# Patient Record
Sex: Female | Born: 1955 | Race: Black or African American | Hispanic: No | Marital: Single | Smoking: Never smoker
Health system: Southern US, Community
[De-identification: ages and names within clinical notes are randomized; demographics above are authoritative.]

## PROBLEM LIST (undated history)

## (undated) DIAGNOSIS — K219 Gastro-esophageal reflux disease without esophagitis: Secondary | ICD-10-CM

## (undated) DIAGNOSIS — I10 Essential (primary) hypertension: Secondary | ICD-10-CM

## (undated) DIAGNOSIS — R928 Other abnormal and inconclusive findings on diagnostic imaging of breast: Secondary | ICD-10-CM

## (undated) HISTORY — DX: Gastro-esophageal reflux disease without esophagitis: K21.9

## (undated) HISTORY — DX: Essential (primary) hypertension: I10

## (undated) HISTORY — DX: Other abnormal and inconclusive findings on diagnostic imaging of breast: R92.8

---

## 2009-02-27 ENCOUNTER — Encounter (INDEPENDENT_AMBULATORY_CARE_PROVIDER_SITE_OTHER): Payer: Self-pay | Admitting: *Deleted

## 2009-02-27 DIAGNOSIS — I10 Essential (primary) hypertension: Secondary | ICD-10-CM

## 2009-02-27 DIAGNOSIS — I152 Hypertension secondary to endocrine disorders: Secondary | ICD-10-CM | POA: Insufficient documentation

## 2009-04-04 ENCOUNTER — Ambulatory Visit: Payer: Self-pay | Admitting: Family Medicine

## 2009-05-13 ENCOUNTER — Encounter: Payer: Self-pay | Admitting: Family Medicine

## 2009-05-13 ENCOUNTER — Ambulatory Visit: Payer: Self-pay | Admitting: Family Medicine

## 2009-05-13 LAB — CONVERTED CEMR LAB
AST: 22 units/L (ref 0–37)
Albumin: 4.3 g/dL (ref 3.5–5.2)
BUN: 13 mg/dL (ref 6–23)
Calcium: 10 mg/dL (ref 8.4–10.5)
Chloride: 103 meq/L (ref 96–112)
Glucose, Bld: 105 mg/dL — ABNORMAL HIGH (ref 70–99)
HDL: 52 mg/dL (ref >39)
Potassium: 3.6 meq/L (ref 3.5–5.3)
Sodium: 141 meq/L (ref 135–145)
Total Protein: 8 g/dL (ref 6.0–8.3)
Triglycerides: 130 mg/dL (ref <150)

## 2009-05-14 ENCOUNTER — Encounter: Payer: Self-pay | Admitting: Family Medicine

## 2009-05-15 ENCOUNTER — Encounter: Payer: Self-pay | Admitting: Family Medicine

## 2009-07-09 ENCOUNTER — Telehealth: Payer: Self-pay | Admitting: Family Medicine

## 2009-07-09 DIAGNOSIS — R911 Solitary pulmonary nodule: Secondary | ICD-10-CM

## 2009-07-10 ENCOUNTER — Encounter: Payer: Self-pay | Admitting: Family Medicine

## 2009-07-11 ENCOUNTER — Encounter: Payer: Self-pay | Admitting: Family Medicine

## 2009-07-11 ENCOUNTER — Ambulatory Visit (HOSPITAL_COMMUNITY): Admission: RE | Admit: 2009-07-11 | Discharge: 2009-07-11 | Payer: Self-pay | Admitting: Ophthalmology

## 2009-07-19 ENCOUNTER — Ambulatory Visit: Payer: Self-pay | Admitting: Family Medicine

## 2009-07-25 ENCOUNTER — Telehealth: Payer: Self-pay | Admitting: Family Medicine

## 2009-07-25 ENCOUNTER — Ambulatory Visit (HOSPITAL_COMMUNITY): Admission: RE | Admit: 2009-07-25 | Discharge: 2009-07-25 | Payer: Self-pay | Admitting: Family Medicine

## 2009-07-25 ENCOUNTER — Encounter: Payer: Self-pay | Admitting: Family Medicine

## 2009-07-30 ENCOUNTER — Telehealth: Payer: Self-pay | Admitting: Family Medicine

## 2009-07-30 ENCOUNTER — Ambulatory Visit (HOSPITAL_COMMUNITY): Admission: RE | Admit: 2009-07-30 | Discharge: 2009-07-30 | Payer: Self-pay | Admitting: Family Medicine

## 2009-07-31 ENCOUNTER — Telehealth: Payer: Self-pay | Admitting: *Deleted

## 2009-07-31 DIAGNOSIS — R1909 Other intra-abdominal and pelvic swelling, mass and lump: Secondary | ICD-10-CM

## 2009-08-01 ENCOUNTER — Ambulatory Visit (HOSPITAL_COMMUNITY): Admission: RE | Admit: 2009-08-01 | Discharge: 2009-08-01 | Payer: Self-pay | Admitting: Family Medicine

## 2009-08-02 ENCOUNTER — Ambulatory Visit (HOSPITAL_COMMUNITY): Admission: RE | Admit: 2009-08-02 | Discharge: 2009-08-02 | Payer: Self-pay | Admitting: Family Medicine

## 2009-08-05 ENCOUNTER — Telehealth: Payer: Self-pay | Admitting: Family Medicine

## 2009-08-05 ENCOUNTER — Encounter: Payer: Self-pay | Admitting: Family Medicine

## 2009-08-29 ENCOUNTER — Ambulatory Visit: Payer: Self-pay | Admitting: Family Medicine

## 2009-08-29 ENCOUNTER — Encounter: Payer: Self-pay | Admitting: Family Medicine

## 2009-08-30 LAB — CONVERTED CEMR LAB: CA 125: 5.7 units/mL (ref 0.0–30.2)

## 2009-09-21 HISTORY — PX: CATARACT EXTRACTION: SUR2

## 2009-10-24 ENCOUNTER — Ambulatory Visit: Payer: Self-pay | Admitting: Obstetrics & Gynecology

## 2009-10-24 ENCOUNTER — Ambulatory Visit: Payer: Self-pay | Admitting: Family Medicine

## 2009-10-24 DIAGNOSIS — R05 Cough: Secondary | ICD-10-CM

## 2009-10-24 DIAGNOSIS — M25519 Pain in unspecified shoulder: Secondary | ICD-10-CM | POA: Insufficient documentation

## 2009-10-29 ENCOUNTER — Telehealth: Payer: Self-pay | Admitting: *Deleted

## 2009-10-29 ENCOUNTER — Ambulatory Visit (HOSPITAL_COMMUNITY): Admission: RE | Admit: 2009-10-29 | Discharge: 2009-10-29 | Payer: Self-pay | Admitting: Family Medicine

## 2009-11-05 ENCOUNTER — Telehealth: Payer: Self-pay | Admitting: Family Medicine

## 2009-11-07 ENCOUNTER — Telehealth (INDEPENDENT_AMBULATORY_CARE_PROVIDER_SITE_OTHER): Payer: Self-pay | Admitting: *Deleted

## 2009-11-15 ENCOUNTER — Ambulatory Visit: Payer: Self-pay | Admitting: Obstetrics & Gynecology

## 2010-01-06 ENCOUNTER — Ambulatory Visit (HOSPITAL_COMMUNITY): Admission: RE | Admit: 2010-01-06 | Discharge: 2010-01-06 | Payer: Self-pay | Admitting: Family Medicine

## 2010-01-07 ENCOUNTER — Telehealth: Payer: Self-pay | Admitting: Family Medicine

## 2010-01-07 ENCOUNTER — Encounter: Payer: Self-pay | Admitting: Family Medicine

## 2010-02-11 ENCOUNTER — Ambulatory Visit: Payer: Self-pay | Admitting: Family Medicine

## 2010-02-21 ENCOUNTER — Ambulatory Visit: Payer: Self-pay | Admitting: Family Medicine

## 2010-02-21 DIAGNOSIS — M25569 Pain in unspecified knee: Secondary | ICD-10-CM | POA: Insufficient documentation

## 2010-07-11 ENCOUNTER — Ambulatory Visit: Payer: Self-pay | Admitting: Family Medicine

## 2010-08-13 ENCOUNTER — Ambulatory Visit: Payer: Self-pay | Admitting: Family Medicine

## 2010-08-26 ENCOUNTER — Ambulatory Visit (HOSPITAL_COMMUNITY)
Admission: RE | Admit: 2010-08-26 | Discharge: 2010-08-26 | Payer: Self-pay | Source: Home / Self Care | Admitting: Family Medicine

## 2010-09-10 ENCOUNTER — Ambulatory Visit (HOSPITAL_COMMUNITY)
Admission: RE | Admit: 2010-09-10 | Discharge: 2010-09-10 | Payer: Self-pay | Source: Home / Self Care | Attending: Family Medicine | Admitting: Family Medicine

## 2010-09-24 ENCOUNTER — Encounter
Admission: RE | Admit: 2010-09-24 | Discharge: 2010-09-24 | Payer: Self-pay | Source: Home / Self Care | Attending: Family Medicine | Admitting: Family Medicine

## 2010-10-12 ENCOUNTER — Encounter: Payer: Self-pay | Admitting: Family Medicine

## 2010-10-21 NOTE — Assessment & Plan Note (Signed)
Summary: RESCH'D FROM 5/24 FOR F/U [facility]   Vital Signs:  Patient profile:   55 year old female Height:      59.5 inches Weight:      142 pounds BMI:     28.30 BSA:     1.61 Temp:     97.6 degrees F Pulse rate:   75 / minute BP sitting:   125 / 86  Vitals Entered By: Jone Baseman CMA (February 21, 2010 8:51 AM) CC: 3 month check-up Is Patient Diabetic? No Pain Assessment Patient in pain? no        Primary Care Provider:  Romero Belling MD  CC:  3 month check-up.  History of Present Illness: 55 yo female presenting for  KNEE PAIN, RIGHT (ICD-719.46) Intermittent 5/10 pain on the superior lateral aspect of the patella approximately every 2-3 days for years.  Course is stable, not getting more severe or more frequent.  Feels worse on treadmill.  Occasionally has weakness associated with it, though has never locked up or given out.  Denies history of trauma to the knee.  Does not use any medicines or other treatments for this.  COUGH (ICD-786.2) Chronic productive cough associated with sneezing, since approximately February.  Moderate relief from Mucinex.  Patient with known history of tuberculosis and LEFT pulmonary nodule.  No dysnpea or fevers.  Also denies night sweats, hemoptysis, weight loss, wheeze, rhinorrhea, nasal congestion, otalgia, sore throat.  HYPERTENSION (ICD-401.9) Taking HCTZ as prescribed without side effects.  Denies chest pain, dyspnea, PND, LE edema.  Some question of intermittent orthopnea, though not clear on this.  We also addressed:  ADNEXAL MASS, LEFT (ICD-789.39) Advised need to have repeat U/S in February 2012.  Daughter and patient verbalize understanding.  PULMONARY NODULE (ICD-518.89) Advised need to have repeat CT in October 2011.  Daughter and patient verbalize understanding.  ** Daughter present for translation.  Habits & Providers  Alcohol-Tobacco-Diet     Tobacco Status: never  Current Medications (verified): 1)  Hydrochlorothiazide  25 Mg  Tabs (Hydrochlorothiazide) .Marland Kitchen.. 1 Tab By Mouth Daily For High Blood Pressure  Allergies (verified): No Known Drug Allergies  Past History:  Past Surgical History: Cataract surgery 2010--much improved vision.  Physical Exam  Additional Exam:  VITALS:  Reviewed, normal GEN: Alert & oriented, no acute distress, pleasant, cooperative NECK: Midline trachea, no masses/thyromegaly, no cervical lymphadenopathy CARDIO: Regular rate and rhythm, no murmurs/rubs/gallops, 2+ bilateral radial pulses RESP: Clear to auscultation, normal work of breathing, no retractions/accessory muscle use EXT: Nontender, no edema MSK:  RIGHT KNEE - 5/5 strength and FROM flexion/extension, nontender, no swelling, mobile patella, no joint line tenderness.  Negative McMurray and anterior drawer.   Impression & Recommendations:  Problem # 1:  KNEE PAIN, RIGHT (ICD-719.46) Assessment New  Likely osteoarthritis.  See patient instructions.  Orders: [facility]- Est  Level 4 ([address])  Problem # 2:  COUGH (ICD-786.2) Assessment: Unchanged Likely allergic rhinitis.  Will treat presumptively with Flonase and Cetirizine.  Given h/o treated TB, will check CXR despite absence of worrisome symptoms. Orders: CXR- 2view (CXR) [facility]- Est  Level 4 ([address])  Problem # 3:  HYPERTENSION (ICD-401.9) Assessment: Improved  Check yearly BMet in August.  No changes today. Her updated medication list for this problem includes:    Hydrochlorothiazide 25 Mg Tabs (Hydrochlorothiazide) .Marland Kitchen... 1 tab by mouth daily for high blood pressure  BP today: 125/86 Prior BP: 122/82 (02/11/2010)  Labs Reviewed: K+: 3.6 (05/13/2009) Creat: : 0.76 (05/13/2009)   Chol:  184 (05/13/2009)   HDL: 52 (05/13/2009)   LDL: 106 (05/13/2009)   TG: 130 (05/13/2009)  Orders: [facility]- Est  Level 4 ([address])  Problem # 4:  ADNEXAL MASS, LEFT (ICD-789.39) Assessment: Comment Only Advised need to have repeat U/S in February 2012.  Daughter and patient verbalize  understanding.  Problem # 5:  PULMONARY NODULE (ICD-518.89) Assessment: Comment Only Advised need to have repeat CT in October 2011.  Daughter and patient verbalize understanding.  Problem # 6:  Preventive Health Care (ICD-V70.0) Assessment: Comment Only  At end of visit, daughter requests referral to dentist for preventive care.  Dental complaint not addressed.  Referral made.  Orders: Dental Referral (Dentist)  Complete Medication List: 1)  Hydrochlorothiazide 25 Mg Tabs (Hydrochlorothiazide) .Marland Kitchen.. 1 tab by mouth daily for high blood pressure 2)  Cetirizine Hcl 10 Mg Tabs (Cetirizine hcl) .Marland Kitchen.. 1 tab by mouth daily for allergies 3)  Fluticasone Propionate 50 Mcg/act Susp (Fluticasone propionate) .Marland Kitchen.. 1 spray each nostril daily for allergies  Patient Instructions: 1)  ** Please schedule an appointment in July or August to meet your new doctor. 2)  For you knee pain, please use ice 20 minutes every 2 hours (as much as possible) and Tylenol 650 mg by mouth three times a day.  Schedule a follow up appointment if not improving with these measures. 3)  For your cough, I have ordered a chest Xray.  I will call and discuss any abnormal results with you.  I think this is likely allergies, so start Fluticasone and Cetirizine as prescribed below. 4)  Please schedule an appointment in October to have your repeat lung CT ordered. 5)  Please schedule an appointment in February to have your repeat pelvic ultrasound ordered. Prescriptions: FLUTICASONE PROPIONATE 50 MCG/ACT SUSP (FLUTICASONE PROPIONATE) 1 spray each nostril daily for allergies  #1 x 1   Entered and Authorized by:   Romero Belling MD   Signed by:   Romero Belling MD on 02/21/2010   Method used:   Electronically to        [facility] [phone]* (retail)       [address]       [place], [place]  [address]       Ph: [phone]       Fax: [phone]   RxID:   [id number] CETIRIZINE HCL 10 MG TABS  (CETIRIZINE HCL) 1 tab by mouth daily for allergies  #30 x 1   Entered and Authorized by:   Romero Belling MD   Signed by:   Romero Belling MD on 02/21/2010   Method used:   Electronically to        [facility] [phone]* (retail)       [address]       [place], [place]  [address]       Ph: [phone]       Fax: [phone]   RxID:   [id number]

## 2010-10-21 NOTE — Assessment & Plan Note (Signed)
Summary: flu shot/bmc  Nurse Visit   Vital Signs:  Patient profile:   55 year old female Temp:     98.3 degrees F  Vitals Entered By: Theresia Lo RN (July 11, 2010 10:17 AM)  Allergies: No Known Drug Allergies  Immunizations Administered:  Influenza Vaccine # 1:    Vaccine Type: Fluvax 3+    Site: left deltoid    Mfr: GlaxoSmithKline    Dose: 0.5 ml    Given by: Theresia Lo RN    Exp. Date: 03/18/2011    Lot #: [id number]    VIS given: 04/15/10 version given July 11, 2010.  Flu Vaccine Consent Questions:    Do you have a history of severe allergic reactions to this vaccine? no    Any prior history of allergic reactions to egg and/or gelatin? no    Do you have a sensitivity to the preservative Thimersol? no    Do you have a past history of Guillan-Barre Syndrome? no    Do you currently have an acute febrile illness? no    Have you ever had a severe reaction to latex? no    Vaccine information given and explained to patient? yes    Are you currently pregnant? no  Orders Added: 1)  Flu Vaccine 65yrs + [90658] 2)  Admin 1st Vaccine [09811]

## 2010-10-21 NOTE — Progress Notes (Signed)
Summary: phn msg  Phone Note Call from Patient Call back at Home Phone [phone]   Caller: Daughter-Hana Summary of Call: returning call concerning rx.   Initial call taken by: Clydell Hakim,  November 07, 2009 9:20 AM  Follow-up for Phone Call        spoke with daughter see previous message. Follow-up by: Theresia Lo RN,  November 07, 2009 11:16 AM

## 2010-10-21 NOTE — Progress Notes (Signed)
Summary: Rx Prob  Phone Note Call from Patient Call back at Home Phone [phone]   Caller: Daughter-Hannah Summary of Call: Says that the ear drops called in were to expensive.  The pharmacy gave her a name of something else and to see if they would work in its place.  Cortistorin is much cheaper, but not sure if this one is suitiable.  The pharmacy is [facility] on [address]. Initial call taken by: Clydell Hakim,  November 05, 2009 12:02 PM  Follow-up for Phone Call        there was a message from 10/29/2009 and MD sent in another ear drop at that time. message left on voicemail to call back . if she is not aware that this Rx has been sent in, will need to pick up, if this med is too expensive also will send message back to MD. Follow-up by: Theresia Lo RN,  November 05, 2009 1:58 PM  Additional Follow-up for Phone Call Additional follow up Details #1::        spoke with daughter and she states the second eardrops sent  in were as expensive as the first. will forward to MD. Additional Follow-up by: Theresia Lo RN,  November 05, 2009 4:07 PM    Additional Follow-up for Phone Call Additional follow up Details #2::    Okay to try using half strength vinegar in ears two times a day. Follow-up by: Romero Belling MD,  November 05, 2009 4:13 PM  Additional Follow-up for Phone Call Additional follow up Details #3:: Details for Additional Follow-up Action Taken: message left on voicemail to return call. Additional Follow-up by: Theresia Lo RN,  November 06, 2009 4:41 PM   Dr. Constance Goltz gave verbal order to use the half strength vinegar 2-3 drops two times daily. daughter notified. Theresia Lo RN  November 07, 2009 11:17 AM

## 2010-10-21 NOTE — Assessment & Plan Note (Signed)
Summary: f/up,tcb   Vital Signs:  Patient profile:   55 year old female Height:      59.5 inches Weight:      140 pounds BMI:     27.90 BSA:     1.60 Temp:     98.4 degrees F Pulse rate:   82 / minute BP sitting:   137 / 82  Vitals Entered By: Jone Baseman CMA (October 24, 2009 9:56 AM) CC: f/u Is Patient Diabetic? No Pain Assessment Patient in pain? yes     Location: left shoulder Intensity: 4   CC:  f/u.  History of Present Illness: 55 yo female here for:  HTN.  No chest pain or dyspnia.  Taking medicines as prescribed.  LEFT OTALGIA.  Duration weeks.  No hearing loss.  LEFT SHOULDER PAIN.  Trapezius soreness without weakness x1 week.  Using heat packs with minimal relief.  No medicines.  COUGH.  Duration 2-3 months.  Improved with mucinex but now returned.  No fever, dyspnea, dyspepsia, chest pain.  Had rhinorrhea last week.  Notably has lung mass of unknown significance found on CXR which is to be followed up in a few months.  LEFT ADNEXAL MASS.  Found incidentally.  Has f/u appt with [facility] clinic today at 1 pm.  PREVENTION.  Due for mammo.  Pap up to date.  Requests 3rd tetanus vaccination today.  Flu up to date.  Habits & Providers  Alcohol-Tobacco-Diet     Tobacco Status: never  Current Problems (verified): 1)  Hypertension  (ICD-401.9) 2)  Cerumen Impaction, Left  (ICD-380.4) 3)  Shoulder Pain, Left  (ICD-719.41) 4)  Cough  (ICD-786.2) 5)  Adnexal Mass, Left  (ICD-789.39) 6)  Pulmonary Nodule  (ICD-518.89) 7)  Cataracts, Bilateral  (ICD-366.9)  Current Medications (verified): 1)  Hydrochlorothiazide 25 Mg  Tabs (Hydrochlorothiazide) .Marland Kitchen.. 1 Tab By Mouth Daily For High Blood Pressure  Allergies (verified): No Known Drug Allergies  Past History:  Past Surgical History: Cataract surgery 2010.  Physical Exam  General:  Well-developed,well-nourished,in no acute distress; alert,appropriate and cooperative throughout examination Ears:  Cerumen  left ear.  Red inflamed ear canal after irrigation. Lungs:  Normal respiratory effort, chest expands symmetrically. Lungs are clear to auscultation, no crackles or wheezes. Heart:  Normal rate and regular rhythm. S1 and S2 normal without gallop, murmur, click, rub or other extra sounds. Msk:  5/5 strength and FROM bilateral UE.  No focal tenderness or swelling.   Impression & Recommendations:  Problem # 1:  HYPERTENSION (ICD-401.9) Assessment Improved  Well controlled. Her updated medication list for this problem includes:    Hydrochlorothiazide 25 Mg Tabs (Hydrochlorothiazide) .Marland Kitchen... 1 tab by mouth daily for high blood pressure  BP today: 137/82 Prior BP: 145/92 (07/19/2009)  Labs Reviewed: K+: 3.6 (05/13/2009) Creat: : 0.76 (05/13/2009)   Chol: 184 (05/13/2009)   HDL: 52 (05/13/2009)   LDL: 106 (05/13/2009)   TG: 130 (05/13/2009)  Orders: [facility]- Est  Level 4 ([address])  Problem # 2:  CERUMEN IMPACTION, LEFT (ICD-380.4) Assessment: New  Irrigation in clinic today.  Likely otitis externa component--ciprodex x7 days.  Orders: [facility]- Est  Level 4 ([address])  Problem # 3:  SHOULDER PAIN, LEFT (ICD-719.41) Assessment: New  Likely muscle strain.  Advised heat, APAP, given Rx for Flexeril though they will try other measures first.  Her updated medication list for this problem includes:    Cyclobenzaprine Hcl 10 Mg Tabs (Cyclobenzaprine hcl) .Marland Kitchen... 1/2 - 1 tab by mouth three times  a day as needed for shoulder pain  Orders: [facility]- Est  Level 4 ([address])  Problem # 4:  COUGH (ICD-786.2) Assessment: New  Postnasal drip secondary to allergies vs asthma vs GERD.  Favor allergies at this point.  Will treat with Cetirizine.  Patient has pulmonary nodule found on CXR which will require f/u so asked to RTC in 1 week if not better.  Orders: [facility]- Est  Level 4 ([address])  Problem # 5:  ADNEXAL MASS, LEFT (ICD-789.39) Assessment: Comment Only [facility] g[facility] clinic [facility] at [facility].  Problem # 6:  PULMONARY  NODULE (ICD-518.89) LUL nodule found on routine CXR for pre-op on 10/18.  Negative PET scan--recommend 6 month follow-up.  Will schedule CT scan of chest for mid-April today.  Future order for Bmet entered so Cr can be checked 1 week prior to CT.  Orders: CT with Contrast (CT w/ contrast)Future Orders: Basic Met-[facility] ([address]-28413) ... 10/24/2010  Problem # 7:  Preventive Health Care (ICD-V70.0) Assessment: Comment Only Td vaccination today.  She has already received Td x1 and Tdap x1, so this will complete her initial series.  Due for booster in 10 years. Given mammo info today. Will discuss colon screening at next visit. Flu and Pap up to date.  Complete Medication List: 1)  Hydrochlorothiazide 25 Mg Tabs (Hydrochlorothiazide) .Marland Kitchen.. 1 tab by mouth daily for high blood pressure 2)  Cyclobenzaprine Hcl 10 Mg Tabs (Cyclobenzaprine hcl) .... 1/2 - 1 tab by mouth three times a day as needed for shoulder pain 3)  Ciprodex 0.3-0.1 % Susp (Ciprofloxacin-dexamethasone) .... 4 gtt left ear two times a day x7 days  Other Orders: TD Toxoids IM 7 YR + ([address]) Admin 1st Vaccine (02725) Admin 1st Vaccine [facility]) [phone])  Patient Instructions: 1)  We are scheduling a CT of the chest to follow up the nodule that was found during your chest Xray.  This will be in April--we will call with appointment. 2)  Please try Cetirizine 10 mg (generic, over the counter) for the cough.  If not better in 1 week, please come back. 3)  For shoulder pain, use Acetaminophen 1000 mg by mouth three times a day.  Continue to use heat backs.  I have also given you a prescription for Cyclobenzaprine to try as well in case Acetaminophen and heat don't help. 4)  Please schedule an appointment to have your mammogram as soon as possible.  You can do this by calling either [facility] [phone]) or [facility] [place] [phone]). 5)  Please schedule a follow-up appointment in 3 months .    Prescriptions: CIPRODEX 0.3-0.1 % SUSP (CIPROFLOXACIN-DEXAMETHASONE) 4 gtt left ear two times a day x7 days  #1 x 0   Entered and Authorized by:   Romero Belling MD   Signed by:   Romero Belling MD on 10/24/2009   Method used:   Electronically to        [facility] [phone]* (retail)       [address]       [place], [place]  [address]       Ph: [phone]       Fax: [phone]   RxID:   [id number] CYCLOBENZAPRINE HCL 10 MG TABS (CYCLOBENZAPRINE HCL) 1/2 - 1 tab by mouth three times a day as needed for shoulder pain  #60 x 0   Entered and Authorized by:   Romero Belling MD   Signed  by:   Romero Belling MD on 10/24/2009   Method used:   Electronically to        [facility] [phone]* (retail)       [address]       [place], [place]  [address]       Ph: [phone]       Fax: [phone]   RxID:   [id number]    Tetanus/Td Vaccine    Vaccine Type: Td    Site: left deltoid    Mfr: GlaxoSmithKline    Dose: 0.5 ml    Route: IM    Given by: Garen Grams LPN    Exp. Date: 07/16/2011    Lot #: [id number]    VIS given: 08/09/07 version given October 24, 2009.

## 2010-10-21 NOTE — Progress Notes (Signed)
Summary: meds prob  Phone Note Call from Patient Call back at Home Phone [phone]   Caller: Daughter-Hana Summary of Call: ear drops are too expensive and would like something a little less expensive [facility] Initial call taken by: De Nurse,  October 29, 2009 9:56 AM  Follow-up for Phone Call        to pcp to change Follow-up by: Golden Circle RN,  October 29, 2009 10:01 AM  Additional Follow-up for Phone Call Additional follow up Details #1::        Have sent Rx for [facility], which should be cheaper.  [facility], please call patient to inform. Additional Follow-up by: Romero Belling MD,  October 29, 2009 10:13 AM    Additional Follow-up for Phone Call Additional follow up Details #2::    Pt dgt informed Follow-up by: Jone Baseman CMA,  October 29, 2009 12:04 PM  New/Updated Medications: VOSOL HC 2-1 % SOLN (HYDROCORTISONE-ACETIC ACID) Apply 5 gtt to affected ear three times a day Prescriptions: VOSOL HC 2-1 % SOLN (HYDROCORTISONE-ACETIC ACID) Apply 5 gtt to affected ear three times a day  #1 x 0   Entered and Authorized by:   Romero Belling MD   Signed by:   Romero Belling MD on 10/29/2009   Method used:   Electronically to        [facility] [phone]* (retail)       [address]       [place], [place]  [address]       Ph: [phone]       Fax: [phone]   RxID:   [id number]

## 2010-10-21 NOTE — Progress Notes (Signed)
Summary: CT Results  Phone Note Outgoing Call Call back at [facility] Phone [phone]   Call placed by: Romero Belling MD,  January 07, 2010 2:39 PM Call placed to: Patient Summary of Call: Called to discuss CT results.  No answer.  Will send letter.

## 2010-10-21 NOTE — Letter (Signed)
Summary: CT Results  [facility]  [address]   [place], [place] [address]   Phone: [phone]  Fax: [phone]    01/07/2010  SHUNTELL FOODY [address] [place], [place]  [address]  Dear Ms. Army Melia,  I have reviewed the results of your CT scan.  The results are as follows:  "No significant change in size of left lower lobe pulmonary nodule of concern.  This nodule was not hypermetabolic on comparison FDG PET CT and therefore is likely benign.  This may relate to patient's report history of prior tuberculosis.  Due to its large size, recommend follow-up non contrast CT in 6 to 12 months (depending on risks factors for carcinoma) to insure stability and excluded a non hypermetabolic neoplasm."  While it looks like the nodule is not likely to be cancer, we will need to repeat the CT scan in 6-12 months.  Please schedule a visit with my clinic in 6 months to set up the next CT scan.  Sincerely, Romero Belling MD  Appended Document: CT Results mailed.

## 2010-10-21 NOTE — Assessment & Plan Note (Signed)
Summary: meet new doc/bp [facility]   Vital Signs:  Patient profile:   55 year old female Height:      59.5 inches Weight:      144.6 pounds BMI:     28.82 Temp:     98.0 degrees F oral Pulse rate:   82 / minute BP sitting:   131 / 79  (left arm) Cuff size:   regular  Vitals Entered By: Garen Grams LPN (August 13, 2010 8:52 AM) CC: f/u, Meet New MD Is Patient Diabetic? No Pain Assessment Patient in pain? yes     Location: rt knee   Primary Care Provider:  Romero Belling MD  CC:  f/u and Meet New MD.  History of Present Illness: Issues include:  1. Right knee pain:  This is a chronic issue and has been bothering her for years.  She takes Tylenol as needed for the pain which does help.  She more recently has felt like her knee is weak.  Pain is manageable.  Rated a 5/10.  Pain is located at the medial part of the right knee.  ROS: denies catching, locking, or giving out  2. HTN:  She is taking the HCTZ as prescribed.  She doesn't check her blood pressure at home regularly.  ROS: denies chest pain, shortness of breath  3. Persistent cough:  She has had a persistent cough for the past 6 months.  She describes the episodes of pain in her middle chest, followed by some wheezing and coughing.  The episodes last for a couple of minutes and then get better.  Unsure if there is an association with certain foods.  ROS: denies fevers, chills, weight loss, lumps, or bumps  4. Pulmonary nodule:  Had a CT scan about 7 months ago and was supposed to have a follow up one in 6-12 months.  Habits & Providers  Alcohol-Tobacco-Diet     Tobacco Status: never     Tobacco Counseling: not indicated; no tobacco use  Current Medications (verified): 1)  Hydrochlorothiazide 25 Mg  Tabs (Hydrochlorothiazide) .Marland Kitchen.. 1 Tab By Mouth Daily For High Blood Pressure 2)  Omeprazole 10 Mg Cpdr (Omeprazole) .Marland Kitchen.. 1 Tab By Mouth Daily For Acid Reflux  Allergies: No Known Drug Allergies  Past History:  Past  Medical History: Reviewed history from 07/19/2009 and no changes required. Pulmonary Tuberculosis, 1998, treated for 8 months.  Social History: Reviewed history from 07/19/2009 and no changes required. Pt lives with her daughter and son-in-law.  She is currently unemployeed.  Uses no tobacco, alcohol, or illicit drugs.  Emigrated from [place] 2010.  No regular healthcare prior to 03/2009.  Physical Exam  General:  Vitals reviewed.  Well appearing.  Overweight.  No acute distress Head:  normocephalic and atraumatic.   Eyes:  vision grossly intact.  normal conjunctiva Ears:  R ear normal and L ear normal.   Nose:  no nasal discharge.   Mouth:  pharynx pink and moist.   Neck:  supple, full ROM, and no masses.   Lungs:  normal respiratory effort, normal breath sounds, no dullness, no crackles, and no wheezes.   Some transmitted upper respiratory sounds. Heart:  Normal rate and regular rhythm. S1 and S2 normal without gallop, murmur, click, rub or other extra sounds. Abdomen:  soft, non-tender, normal bowel sounds, and no distention.   Msk:  Right knee:  No swelling, redness, or warmth.  No gross deformity.  TTP along medial joint line.  Full ROM.  Good joint  stability.  Negative McMurrays.  Left knee: normal Extremities:  no LE edema Skin:  turgor normal and color normal.   Psych:  not anxious appearing and not depressed appearing.     Impression & Recommendations:  Problem # 1:  KNEE PAIN, RIGHT (ICD-719.46) Assessment Deteriorated Likely from arthritis.  Exam not concerning for meniscus or ligament damage.  Will send for x-ray to confirm.  Treat with daily Tylenol. Orders: Diagnostic X-Ray/Fluoroscopy (Diagnostic X-Ray/Flu) [facility]- Est  Level 4 (16109)  Problem # 2:  COUGH (ICD-786.2) Assessment: Unchanged  Persists.  No medications that could be related.  Pt with a hx of a pulmonary nodule.  This could be related to acid reflux given the substernal discomfort and wheezing and  coughing.  Will try Omeprazole.  Orders: [facility]- Est  Level 4 (60454)  Problem # 3:  HYPERTENSION (ICD-401.9) Assessment: Unchanged  At goal.  Continue current medications. Her updated medication list for this problem includes:    Hydrochlorothiazide 25 Mg Tabs (Hydrochlorothiazide) .Marland Kitchen... 1 tab by mouth daily for high blood pressure  Orders: [facility]- Est  Level 4 (09811)  Problem # 4:  PULMONARY NODULE (ICD-518.89) Assessment: Unchanged Repeat CT scan. Orders: CT without Contrast (CT w/o contrast) [facility]- Est  Level 4 (91478)  Complete Medication List: 1)  Hydrochlorothiazide 25 Mg Tabs (Hydrochlorothiazide) .Marland Kitchen.. 1 tab by mouth daily for high blood pressure 2)  Omeprazole 10 Mg Cpdr (Omeprazole) .Marland Kitchen.. 1 tab by mouth daily for acid reflux  Patient Instructions: 1)  We will get a CT scan of your lungs to make sure the nodule is the same size and to make sure nothing else is going on in your lungs to cause you to cough 2)  We will also get x-rays of your right knee to see if it is arthritis 3)  I would like for you to start taking a medicine for acid reflux to see if that helps with the cough 4)  You can take Tylenol 650mg  every day for the knee pain 5)  Please schedule a follow up appointment in 6-8 weeks to check on your cough Prescriptions: OMEPRAZOLE 10 MG CPDR (OMEPRAZOLE) 1 tab by mouth daily for acid reflux  #30 x 6   Entered and Authorized by:   Angelena Sole MD   Signed by:   Angelena Sole MD on 08/13/2010   Method used:   Electronically to        [facility] [phone]* (retail)       [address]       [place], [place]  [address]       Ph: [phone]       Fax: [phone]   RxID:   [id number]    Orders Added: 1)  CT without Contrast [CT w/o contrast] 2)  Diagnostic X-Ray/Fluoroscopy [Diagnostic X-Ray/Flu] 3)  [facility]- Est  Level 4 [25956]

## 2010-10-21 NOTE — Assessment & Plan Note (Signed)
Summary: f/up,tcb   Vital Signs:  Patient profile:   55 year old female Height:      59.5 inches Weight:      142.1 pounds BMI:     28.32 Temp:     99.0 degrees F oral Pulse rate:   88 / minute BP sitting:   122 / 82  (left arm) Cuff size:   regular  Vitals Entered By: Garen Grams LPN (Feb 11, 2010 10:41 AM) CC: f/u Is Patient Diabetic? No Pain Assessment Patient in pain? yes     Location: headache   Primary Care Provider:  Romero Belling MD  CC:  f/u.  History of Present Illness: Patient had to leave before I could see her.  Not evaluted, will reschedule.  Habits & Providers  Alcohol-Tobacco-Diet     Tobacco Status: never  Allergies: No Known Drug Allergies   Complete Medication List: 1)  Hydrochlorothiazide 25 Mg Tabs (Hydrochlorothiazide) .Marland Kitchen.. 1 tab by mouth daily for high blood pressure 2)  Cyclobenzaprine Hcl 10 Mg Tabs (Cyclobenzaprine hcl) .... 1/2 - 1 tab by mouth three times a day as needed for shoulder pain  Other Orders: Provider Misc Charge- [facility] (Misc)

## 2010-11-14 ENCOUNTER — Ambulatory Visit (INDEPENDENT_AMBULATORY_CARE_PROVIDER_SITE_OTHER): Payer: Self-pay | Admitting: Family Medicine

## 2010-11-14 ENCOUNTER — Encounter: Payer: Self-pay | Admitting: Family Medicine

## 2010-11-14 VITALS — BP 160/99 | HR 98 | Temp 98.4°F | Wt 142.2 lb

## 2010-11-14 DIAGNOSIS — R519 Headache, unspecified: Secondary | ICD-10-CM | POA: Insufficient documentation

## 2010-11-14 DIAGNOSIS — G4700 Insomnia, unspecified: Secondary | ICD-10-CM

## 2010-11-14 DIAGNOSIS — R062 Wheezing: Secondary | ICD-10-CM

## 2010-11-14 DIAGNOSIS — R51 Headache: Secondary | ICD-10-CM

## 2010-11-14 MED ORDER — ALBUTEROL 90 MCG/ACT IN AERS: 2.0000 | INHALATION_SPRAY | Freq: Four times a day (QID) | RESPIRATORY_TRACT | Status: AC | PRN

## 2010-11-14 MED ORDER — CETIRIZINE HCL 10 MG PO CHEW: 10.0000 mg | CHEWABLE_TABLET | Freq: Every day | ORAL | Status: DC

## 2010-11-14 NOTE — Assessment & Plan Note (Signed)
Pt is not sleeping well at night.  Advised proper sleep hygiene.  If not better in a couple of weeks would consider sleep aid.

## 2010-11-14 NOTE — Assessment & Plan Note (Signed)
New wheezing.  Has been spending some more time in chlorinated pool.  Will treat with Albuterol / Zyrtec.  This could also be GERD.  Would consider adding PPI if no relief.

## 2010-11-14 NOTE — Assessment & Plan Note (Signed)
Pt with a hx of headaches.  The most recent one has been lasting longer than usual.  Not typical migraine features.  No neurological deficits.  ? Relation to increased chlorine exposure.  She also is having problems with sleep.  Advised supportive care for now.  Tylenol / Ibuprofen as needed.  RTC in 2 weeks if not better

## 2010-11-14 NOTE — Patient Instructions (Signed)
We will try an Albuterol inhaler for your wheezing I would limit the amount of time that is spent in the pool The headache doesn't seem like anything serious.  Take Tylenol / Ibuprofen as needed Please schedule a follow up appointment in 2 weeks.   If your sleep isn't improved by then we would consider a sleep aid.

## 2010-11-14 NOTE — Progress Notes (Signed)
  Subjective:    Patient ID: Stacy Mccormick, female    DOB: 11-21-1955, 55 y.o.   MRN: [id number]  Headache  This is a new problem. The current episode started in the past 7 days. The problem occurs intermittently. The problem has been unchanged. The pain is located in the occipital region. The pain does not radiate. The pain quality is similar to prior headaches. The quality of the pain is described as aching, band-like and squeezing. The pain is at a severity of 3/10. The pain is mild. Associated symptoms include back pain and nausea. Pertinent negatives include no anorexia, blurred vision, coughing, dizziness, eye pain, fever, hearing loss, loss of balance, numbness, phonophobia, photophobia or weakness. The symptoms are aggravated by nothing (chlorine?). She has tried NSAIDs for the symptoms. The treatment provided significant relief.  Back Pain This is a new problem. The current episode started in the past 7 days. The problem occurs intermittently. The problem has been gradually improving since onset. The pain is present in the thoracic spine. The quality of the pain is described as stabbing. The pain does not radiate. The pain is at a severity of 0/10. The patient is experiencing no pain. The symptoms are aggravated by coughing. Associated symptoms include headaches. Pertinent negatives include no fever, numbness or weakness. (Wheezing) She has tried nothing for the symptoms.      Review of Systems  Constitutional: Negative for fever.  HENT: Negative for hearing loss.   Eyes: Negative for blurred vision, photophobia and pain.  Respiratory: Negative for cough.   Gastrointestinal: Positive for nausea. Negative for anorexia.  Musculoskeletal: Positive for back pain.  Neurological: Positive for headaches. Negative for dizziness, weakness, numbness and loss of balance.       Objective:   Physical Exam  Constitutional: She is oriented to person, place, and time. She appears well-developed  and well-nourished.  HENT:  Head: Normocephalic and atraumatic.  Eyes: Conjunctivae and EOM are normal. Pupils are equal, round, and reactive to light.  Neck: Normal range of motion. Neck supple. No thyromegaly present.  Cardiovascular: Normal rate, regular rhythm and normal heart sounds.   Pulmonary/Chest: Effort normal. No respiratory distress. She has wheezes. She has no rales. She exhibits no tenderness.  Abdominal: Soft. Bowel sounds are normal.  Neurological: She is alert and oriented to person, place, and time. No cranial nerve deficit. Coordination normal.       No focal deficits  Skin: Skin is warm and dry.  Psychiatric: She has a normal mood and affect.          Assessment & Plan:

## 2010-12-25 LAB — COMPREHENSIVE METABOLIC PANEL
ALT: 23 U/L (ref 0–35)
AST: 29 U/L (ref 0–37)
Alkaline Phosphatase: 50 U/L (ref 39–117)
CO2: 25 mEq/L (ref 19–32)
Calcium: 10 mg/dL (ref 8.4–10.5)
Chloride: 103 mEq/L (ref 96–112)
GFR calc Af Amer: >60 mL/min (ref >60)
GFR calc non Af Amer: >60 mL/min (ref >60)
Glucose, Bld: 101 mg/dL — ABNORMAL HIGH (ref 70–99)
Potassium: 3.8 mEq/L (ref 3.5–5.1)
Sodium: 138 mEq/L (ref 135–145)
Total Bilirubin: 0.5 mg/dL (ref 0.3–1.2)

## 2010-12-25 LAB — CBC
Hemoglobin: 14.3 g/dL (ref 12.0–15.0)
MCHC: 34.3 g/dL (ref 30.0–36.0)
RBC: 4.77 MIL/uL (ref 3.87–5.11)
WBC: 9 10*3/uL (ref 4.0–10.5)

## 2010-12-25 LAB — DIFFERENTIAL
Basophils Absolute: 0 10*3/uL (ref 0.0–0.1)
Basophils Relative: 0 % (ref 0–1)
Eosinophils Absolute: 0.3 10*3/uL (ref 0.0–0.7)
Eosinophils Relative: 3 % (ref 0–5)
Neutrophils Relative %: 48 % (ref 43–77)

## 2010-12-25 LAB — URINALYSIS, ROUTINE W REFLEX MICROSCOPIC
Glucose, UA: NEGATIVE mg/dL
Ketones, ur: NEGATIVE mg/dL
Protein, ur: NEGATIVE mg/dL
Urobilinogen, UA: 0.2 mg/dL (ref 0.0–1.0)

## 2010-12-25 LAB — URINE MICROSCOPIC-ADD ON

## 2011-11-30 ENCOUNTER — Other Ambulatory Visit (HOSPITAL_COMMUNITY): Payer: Self-pay | Admitting: Family Medicine

## 2011-11-30 DIAGNOSIS — Z1231 Encounter for screening mammogram for malignant neoplasm of breast: Secondary | ICD-10-CM

## 2011-12-03 ENCOUNTER — Ambulatory Visit (HOSPITAL_COMMUNITY)
Admission: RE | Admit: 2011-12-03 | Discharge: 2011-12-03 | Disposition: A | Discharge status: Home / Self Care | Payer: Self-pay | Source: Ambulatory Visit | Attending: Family Medicine | Admitting: Family Medicine

## 2011-12-03 DIAGNOSIS — Z1231 Encounter for screening mammogram for malignant neoplasm of breast: Secondary | ICD-10-CM | POA: Insufficient documentation

## 2011-12-08 ENCOUNTER — Other Ambulatory Visit: Payer: Self-pay | Admitting: Family Medicine

## 2011-12-08 DIAGNOSIS — R928 Other abnormal and inconclusive findings on diagnostic imaging of breast: Secondary | ICD-10-CM

## 2011-12-09 ENCOUNTER — Encounter: Payer: Self-pay | Admitting: Family Medicine

## 2011-12-09 DIAGNOSIS — R928 Other abnormal and inconclusive findings on diagnostic imaging of breast: Secondary | ICD-10-CM | POA: Insufficient documentation

## 2011-12-09 HISTORY — DX: Other abnormal and inconclusive findings on diagnostic imaging of breast: R92.8

## 2011-12-14 ENCOUNTER — Other Ambulatory Visit: Payer: Self-pay

## 2011-12-16 ENCOUNTER — Ambulatory Visit
Admission: RE | Admit: 2011-12-16 | Discharge: 2011-12-16 | Disposition: A | Discharge status: Home / Self Care | Payer: Self-pay | Source: Ambulatory Visit | Attending: Family Medicine | Admitting: Family Medicine

## 2011-12-16 ENCOUNTER — Encounter: Payer: Self-pay | Admitting: Family Medicine

## 2011-12-16 DIAGNOSIS — R928 Other abnormal and inconclusive findings on diagnostic imaging of breast: Secondary | ICD-10-CM

## 2012-03-21 ENCOUNTER — Other Ambulatory Visit: Payer: Self-pay | Admitting: Family Medicine

## 2012-03-21 MED ORDER — HYDROCHLOROTHIAZIDE 25 MG PO TABS: 25.0000 mg | ORAL_TABLET | Freq: Every day | ORAL | Status: DC

## 2012-03-22 ENCOUNTER — Ambulatory Visit (INDEPENDENT_AMBULATORY_CARE_PROVIDER_SITE_OTHER): Payer: Self-pay | Admitting: Family Medicine

## 2012-03-22 ENCOUNTER — Encounter: Payer: Self-pay | Admitting: Family Medicine

## 2012-03-22 VITALS — BP 142/72 | HR 101 | Temp 98.4°F | Ht 59.5 in | Wt 138.6 lb

## 2012-03-22 DIAGNOSIS — E781 Pure hyperglyceridemia: Secondary | ICD-10-CM

## 2012-03-22 DIAGNOSIS — I10 Essential (primary) hypertension: Secondary | ICD-10-CM

## 2012-03-22 DIAGNOSIS — Z0000 Encounter for general adult medical examination without abnormal findings: Secondary | ICD-10-CM

## 2012-03-22 MED ORDER — BETAMETHASONE DIPROPIONATE 0.05 % EX CREA: 1.0000 "application " | TOPICAL_CREAM | Freq: Two times a day (BID) | CUTANEOUS | Status: DC | PRN

## 2012-03-22 MED ORDER — LISINOPRIL 5 MG PO TABS: 5.0000 mg | ORAL_TABLET | Freq: Every day | ORAL | Status: DC

## 2012-03-22 NOTE — Patient Instructions (Signed)
Stacy Mccormick,  Thank for coming in to see me today.  For HTN: Please start lisinopril 5 mg once daily F/u in 2 weeks for repeat creatinine check and BP check.   For your face rash:  For screening: -stool cards x 3: bring back or send in -pap smear due 05/13/2012

## 2012-03-23 ENCOUNTER — Encounter: Payer: Self-pay | Admitting: Family Medicine

## 2012-03-23 DIAGNOSIS — Z0000 Encounter for general adult medical examination without abnormal findings: Secondary | ICD-10-CM | POA: Insufficient documentation

## 2012-03-23 DIAGNOSIS — E781 Pure hyperglyceridemia: Secondary | ICD-10-CM | POA: Insufficient documentation

## 2012-03-23 DIAGNOSIS — E1169 Type 2 diabetes mellitus with other specified complication: Secondary | ICD-10-CM | POA: Insufficient documentation

## 2012-03-23 DIAGNOSIS — E785 Hyperlipidemia, unspecified: Secondary | ICD-10-CM | POA: Insufficient documentation

## 2012-03-23 LAB — COMPREHENSIVE METABOLIC PANEL
Albumin: 4.3 g/dL (ref 3.5–5.2)
Alkaline Phosphatase: 48 U/L (ref 39–117)
BUN: 14 mg/dL (ref 6–23)
CO2: 27 mEq/L (ref 19–32)
Glucose, Bld: 158 mg/dL — ABNORMAL HIGH (ref 70–99)
Potassium: 3.5 mEq/L (ref 3.5–5.3)
Total Bilirubin: 0.3 mg/dL (ref 0.3–1.2)

## 2012-03-23 LAB — LIPID PANEL
Cholesterol: 199 mg/dL (ref 0–200)
HDL: 48 mg/dL (ref >39)
Total CHOL/HDL Ratio: 4.1 Ratio

## 2012-03-23 NOTE — Assessment & Plan Note (Signed)
A: provided stool cards for screening colonoscopy. Patient to f/u in 3 months for pap smear.

## 2012-03-23 NOTE — Assessment & Plan Note (Signed)
Lifestyle modifications and f/u in 3 months.

## 2012-03-23 NOTE — Progress Notes (Signed)
Subjective:     Patient ID: Stacy Mccormick, female   DOB: June 27, 1956, 56 y.o.   MRN: [id number]  HPI 56 yo F originally from [place], primary language Amarhic presents with her daughter for HTN f/u and to discuss the following:  1. HTN: patient compliant with HCTZ. She denies daily headache, chest pain, shortness of breath or LE edema. She does cardio exercise 30 mins per day 3 days per weeks. She is not compliant with a low salt diet.   2. Rash on face: pruritic rash on face and neck x 2 years. The patient has been treating the rash with betamethosone cream. The rash responds to the cream but comes back intermittently.   Review of Systems As per HPI MSK: weakness/laxity in R knee    Objective:   Physical Exam BP 142/72  Pulse 101  Temp 98.4 F (36.9 C) (Oral)  Ht 4' 11.5" (1.511 m)  Wt 138 lb 9.6 oz (62.869 kg)  BMI 27.53 kg/m2 General appearance: alert, cooperative and no distress Eyes: conjunctivae clear. Cataracts noted. PERRL, EOM's intact. Fundi benign. Lungs: clear to auscultation bilaterally Heart: regular rate and rhythm, S1, S2 normal, no murmur, click, rub or gallop Abdomen: normal findings: aorta normal, bowel sounds normal, no bruits heard and no masses palpable Extremities: extremities normal, atraumatic, no cyanosis or edema Pulses: 2+ and symmetric Skin: xerotic papules on face and neck.   Assessment and Plan:

## 2012-03-23 NOTE — Assessment & Plan Note (Signed)
A: Improved from previous reading but not yet at goal. No evidence of end organ damage. Patient would like to increase exercise and comply with low salt diet instead of adding additional medication.  P:  -lifestyle modifications per AVS -f/u in 3 months for BP check.

## 2012-04-04 ENCOUNTER — Encounter: Payer: Self-pay | Admitting: Family Medicine

## 2012-04-06 ENCOUNTER — Ambulatory Visit: Payer: Self-pay | Admitting: Family Medicine

## 2012-12-09 ENCOUNTER — Other Ambulatory Visit: Payer: Self-pay | Admitting: *Deleted

## 2012-12-09 MED ORDER — HYDROCHLOROTHIAZIDE 25 MG PO TABS: 25.0000 mg | ORAL_TABLET | Freq: Every day | ORAL | Status: DC

## 2013-01-30 ENCOUNTER — Ambulatory Visit (INDEPENDENT_AMBULATORY_CARE_PROVIDER_SITE_OTHER): Payer: No Typology Code available for payment source | Admitting: Family Medicine

## 2013-01-30 ENCOUNTER — Other Ambulatory Visit (HOSPITAL_COMMUNITY)
Admission: RE | Admit: 2013-01-30 | Discharge: 2013-01-30 | Disposition: A | Discharge status: Home / Self Care | Payer: No Typology Code available for payment source | Source: Ambulatory Visit | Attending: Family Medicine | Admitting: Family Medicine

## 2013-01-30 ENCOUNTER — Encounter: Payer: Self-pay | Admitting: Family Medicine

## 2013-01-30 VITALS — BP 130/90 | HR 84 | Ht 59.5 in | Wt 142.0 lb

## 2013-01-30 DIAGNOSIS — Z1151 Encounter for screening for human papillomavirus (HPV): Secondary | ICD-10-CM | POA: Insufficient documentation

## 2013-01-30 DIAGNOSIS — Z01419 Encounter for gynecological examination (general) (routine) without abnormal findings: Secondary | ICD-10-CM | POA: Insufficient documentation

## 2013-01-30 DIAGNOSIS — R1909 Other intra-abdominal and pelvic swelling, mass and lump: Secondary | ICD-10-CM

## 2013-01-30 DIAGNOSIS — N841 Polyp of cervix uteri: Secondary | ICD-10-CM | POA: Insufficient documentation

## 2013-01-30 DIAGNOSIS — Z124 Encounter for screening for malignant neoplasm of cervix: Secondary | ICD-10-CM

## 2013-01-30 DIAGNOSIS — I10 Essential (primary) hypertension: Secondary | ICD-10-CM

## 2013-01-30 DIAGNOSIS — Z0000 Encounter for general adult medical examination without abnormal findings: Secondary | ICD-10-CM

## 2013-01-30 DIAGNOSIS — J309 Allergic rhinitis, unspecified: Secondary | ICD-10-CM | POA: Insufficient documentation

## 2013-01-30 DIAGNOSIS — E781 Pure hyperglyceridemia: Secondary | ICD-10-CM

## 2013-01-30 HISTORY — DX: Polyp of cervix uteri: N84.1

## 2013-01-30 LAB — COMPREHENSIVE METABOLIC PANEL
ALT: 16 U/L (ref 0–35)
AST: 19 U/L (ref 0–37)
Albumin: 4.5 g/dL (ref 3.5–5.2)
Alkaline Phosphatase: 43 U/L (ref 39–117)
BUN: 11 mg/dL (ref 6–23)
Calcium: 10.2 mg/dL (ref 8.4–10.5)
Chloride: 100 mEq/L (ref 96–112)
Potassium: 3.7 mEq/L (ref 3.5–5.3)
Sodium: 139 mEq/L (ref 135–145)
Total Protein: 7.8 g/dL (ref 6.0–8.3)

## 2013-01-30 LAB — LIPID PANEL
HDL: 42 mg/dL (ref >39)
LDL Cholesterol: 107 mg/dL — ABNORMAL HIGH (ref 0–99)

## 2013-01-30 MED ORDER — SALINE NASAL SPRAY 0.65 % NA SOLN: 1.0000 | NASAL | Status: DC | PRN

## 2013-01-30 MED ORDER — HYDROCHLOROTHIAZIDE 25 MG PO TABS: 25.0000 mg | ORAL_TABLET | Freq: Every day | ORAL | Status: DC

## 2013-01-30 MED ORDER — CETIRIZINE HCL 10 MG PO TABS: 10.0000 mg | ORAL_TABLET | Freq: Every day | ORAL | Status: DC

## 2013-01-30 NOTE — Assessment & Plan Note (Signed)
Noted on exam. Asymptomatic, however patient agreeable to removal. Will schedule polypectomy in gyn clinic w/in next 3 months.

## 2013-01-30 NOTE — Progress Notes (Signed)
Subjective:     Patient ID: Stacy Mccormick, female   DOB: [birth year], 57 y.o.   MRN: [id number]  HPI 57 yo F from [place] presents with her daughter for f/u:  1. HTN: compliant with HCTZ. occasional HA. Denies CP, SOB and LE edema.   2. Allergies: admits to runny nose. Itchy eyes. Denies fever, sore throat.   3. L ovarian cyst: first noted in 2010. Last ultrasound in 2011. Denies pelvic pain and constipation.  4. Health maintenance: due for pap smear and mammogram. LMP 15 years ago. Denies vaginal bleeding. Denies intercourse. Denies vaginal discharge.   Review of Systems As per HPI     Objective:   Physical Exam BP 130/90  Pulse 84  Ht 4' 11.5" (1.511 m)  Wt 142 lb (64.411 kg)  BMI 28.21 kg/m2 General appearance: alert, cooperative and no distress Eyes: conjunctivae/corneas clear. PERRL, EOM's intact. Ears: normal TM's and external ear canals both ears Nose: no discharge, turbinates red, swollen, no sinus tenderness Throat: lips, mucosa, and tongue normal; teeth and gums normal Lungs: clear to auscultation bilaterally Heart: regular rate and rhythm, S1, S2 normal, no murmur, click, rub or gallop Pelvic: external genitalia normal positive findings: cervical polyp R pedunculated cervical polyp. Vaginal discharge: white discharge, no odor, appears physiological.   no adnexal masses or tenderness, no cervical motion tenderness, rectovaginal septum normal and uterus normal size, shape, and consistency Extremities: extremities normal, atraumatic, no cyanosis or edema    Assessment and Plan:

## 2013-01-30 NOTE — Assessment & Plan Note (Signed)
Start daily antihistamine and nasal saline

## 2013-01-30 NOTE — Assessment & Plan Note (Signed)
Due for f /u ultrasound. Ultrasound scheduled.

## 2013-01-30 NOTE — Patient Instructions (Addendum)
Sassone,   Thank you for coming in today.  Please continue HCTZ for blood pressure. Start cetirizine and nasal saline for allergies.    F/u: L ovarian cyst: ultrasound  Cervical polyp: schedule visit with gyn clinic for polypectomy when you return from [place].  Mammogram   Plan to see me in one year.   Dr. Armen Pickup

## 2013-01-30 NOTE — Assessment & Plan Note (Signed)
Pap done today. Cervical polyp noted. Patient advised to schedule f/u in gyn clinic for polypectomy w/in then next 3 months.

## 2013-01-30 NOTE — Assessment & Plan Note (Addendum)
A: BP at goal.  Meds: compliant P: continue current regimen. Refill sent in.  Check CMP and lipid panel

## 2013-02-01 ENCOUNTER — Ambulatory Visit (HOSPITAL_COMMUNITY)
Admission: RE | Admit: 2013-02-01 | Discharge: 2013-02-01 | Disposition: A | Discharge status: Home / Self Care | Payer: No Typology Code available for payment source | Source: Ambulatory Visit | Attending: Family Medicine | Admitting: Family Medicine

## 2013-02-01 ENCOUNTER — Other Ambulatory Visit: Payer: Self-pay | Admitting: Family Medicine

## 2013-02-01 DIAGNOSIS — R1909 Other intra-abdominal and pelvic swelling, mass and lump: Secondary | ICD-10-CM

## 2013-02-01 DIAGNOSIS — N83209 Unspecified ovarian cyst, unspecified side: Secondary | ICD-10-CM | POA: Insufficient documentation

## 2013-02-02 ENCOUNTER — Other Ambulatory Visit (HOSPITAL_COMMUNITY): Payer: Self-pay

## 2013-02-03 ENCOUNTER — Telehealth: Payer: Self-pay | Admitting: Family Medicine

## 2013-02-03 ENCOUNTER — Encounter: Payer: Self-pay | Admitting: Family Medicine

## 2013-02-03 NOTE — Telephone Encounter (Signed)
Called patient. Left VM.  Labs wnl. Pap negative Ultrasound stable. Letters sent. If she has questions please call.

## 2013-02-07 ENCOUNTER — Other Ambulatory Visit (HOSPITAL_COMMUNITY): Payer: Self-pay

## 2013-04-25 ENCOUNTER — Ambulatory Visit: Payer: No Typology Code available for payment source

## 2013-10-10 ENCOUNTER — Ambulatory Visit: Payer: No Typology Code available for payment source

## 2013-10-13 ENCOUNTER — Ambulatory Visit (INDEPENDENT_AMBULATORY_CARE_PROVIDER_SITE_OTHER): Payer: No Typology Code available for payment source | Admitting: Family Medicine

## 2013-10-13 ENCOUNTER — Encounter: Payer: Self-pay | Admitting: Family Medicine

## 2013-10-13 ENCOUNTER — Ambulatory Visit (HOSPITAL_COMMUNITY)
Admission: RE | Admit: 2013-10-13 | Discharge: 2013-10-13 | Disposition: A | Discharge status: Home / Self Care | Payer: No Typology Code available for payment source | Source: Ambulatory Visit | Attending: Family Medicine | Admitting: Family Medicine

## 2013-10-13 VITALS — BP 170/77 | HR 105 | Temp 98.0°F | Ht 59.5 in | Wt 149.0 lb

## 2013-10-13 DIAGNOSIS — I498 Other specified cardiac arrhythmias: Secondary | ICD-10-CM | POA: Insufficient documentation

## 2013-10-13 DIAGNOSIS — I10 Essential (primary) hypertension: Secondary | ICD-10-CM

## 2013-10-13 DIAGNOSIS — B029 Zoster without complications: Secondary | ICD-10-CM

## 2013-10-13 DIAGNOSIS — R011 Cardiac murmur, unspecified: Secondary | ICD-10-CM

## 2013-10-13 MED ORDER — AMLODIPINE BESYLATE 2.5 MG PO TABS: 2.5000 mg | ORAL_TABLET | Freq: Every day | ORAL | Status: DC

## 2013-10-13 MED ORDER — HYDROCORTISONE 0.5 % EX CREA: 1.0000 "application " | TOPICAL_CREAM | Freq: Two times a day (BID) | CUTANEOUS | Status: DC

## 2013-10-13 MED ORDER — GABAPENTIN 300 MG PO CAPS: 300.0000 mg | ORAL_CAPSULE | Freq: Three times a day (TID) | ORAL | Status: DC

## 2013-10-13 MED ORDER — MUPIROCIN 2 % EX OINT: 1.0000 "application " | TOPICAL_OINTMENT | Freq: Two times a day (BID) | CUTANEOUS | Status: DC

## 2013-10-13 NOTE — Patient Instructions (Addendum)
Stacy Mccormick, it was great to see you today I am sorry that you are not feeling so well.  I would recommend you continue gabapentin for nerve pain. I will prescribe a topical steroid for the lesions on the front and an antibiotic for the lesions on the back If you pain is not improved in 1-2weeks please call the clinic I would also recommend you come back in for the shingles vaccine in about a month or so after your symptoms have gone away I would also like you to get an ECHO to look at your heart given the new murmur that I heard today. Since your blood pressure was a little high I would like to start you on a baby dose of a new medication if you notice swelling headaches or have other issues taking this medication please stop it and call the clinic. See you back in 1 month or sooner if your symptoms do not improve  [name], MD

## 2013-10-13 NOTE — Progress Notes (Signed)
Patient ID: Stacy Mccormick, female   DOB: [birth year], 58 y.o.   MRN: [id number] [facility] Stacy FerrettiMelanie C Karalina Tift, MD Phone: [phone]  Subjective:  57y.o F new to me but established in our clinic presents with daughter for f/up. Translation provided by accompanying family member.   # dx shingles in [place]? 09/06/13 -initially erupted as small vesicular lesions that ruptured, minimal pruritis but burning cessation -still experiencing left arm pain 2/10 assc with overlying skin lesions, no intraarticular pain -not compliant with gabapentin as meds fell on the floor -does have allergies and is taking zyrtec daily -feels that things are gradually improving but would like relief because pt experiencing discomfort while lying on her side  #HTN: denies CP, SOB, LE edema, headache; does have chronic visual changes associated with age -HCTZ daily, reports 100% compliance  #HCM: last pap neg; US stable, screening labs wnl - has never had colonoscopy -not yet received a zostavax   All systems were reviewed and were negative unless otherwise noted in the HPI  Past Medical History Patient Active Problem List   Diagnosis Date Noted  . Cervical polyp 01/30/2013  . Allergic rhinitis 01/30/2013  . Hypertriglyceridemia 03/23/2012  . Health care maintenance 03/23/2012  . KNEE PAIN, RIGHT 02/21/2010  . ADNEXAL MASS, LEFT 07/31/2009  . PULMONARY NODULE 07/09/2009  . HYPERTENSION 02/27/2009   Reviewed problem list.  Medications- reviewed and updated Chief complaint-noted No additions to family history Social history- patient is a never smoker  Objective: BP 170/77  Pulse 105  Temp(Src) 98 F (36.7 C) (Oral)  Ht 4' 11.5" (1.511 m)  Wt 149 lb (67.586 kg)  BMI 29.60 kg/m2 Gen: NAD, alert, cooperative with exam HEENT: NCAT, EOMI, PERRL, wears glasses Neck: FROM, supple, bounding neck pulses CV: RRR, good S1/S2, ii/vi cresc-descr murmur heard loudest at the RSB with  radiation to bilat carotids, bounding pulses in neck, no bruits, cap refill <3 Resp: CTABL, no wheezes, non-labored Abd: SNTND, BS present, no guarding or organomegaly Ext: No edema, warm, normal tone, moves UE/LE spontaneously Neuro: Alert and oriented, No gross deficits Skin: macular patches between breast and involving left arm/axilla and upper back/torso, dermatomal distribution, lesions on back with excoriation and open wounds, does not appear acutely infectious, no pus draining no assc axillary lymphadenopathy  Assessment/Plan: See problem based a/p

## 2013-10-14 DIAGNOSIS — R011 Cardiac murmur, unspecified: Secondary | ICD-10-CM | POA: Insufficient documentation

## 2013-10-14 DIAGNOSIS — B029 Zoster without complications: Secondary | ICD-10-CM | POA: Insufficient documentation

## 2013-10-14 NOTE — Assessment & Plan Note (Signed)
A: BP not at goal 170/77 and repeat 148/80s; daughter attesting to patient stress but also with bounding pulses and new murmur; EKG with left atrial dilitation P: adding norvasc 2.5 daily, monitor for peripheral edema -work up murmur with ECHO -rtc in 2 weeks- 1 month

## 2013-10-14 NOTE — Assessment & Plan Note (Signed)
A: likely aortic stenosis given location quality radiation to the carotids and bounding pulses; EKG today also with some left atrial enlargement which would be consistent with AS; no other documentation of this murmur and daughter not aware P: plan for ECHO monday

## 2013-10-14 NOTE — Assessment & Plan Note (Signed)
A: appears resolving, outbreak [place] in [place]; skin lesions suggestive of zoster and pt describing post herpetic neuralgia, no indication for acyclovir at this time P: vaccinate in 1 month following resolution of outbreak; topical hydrocortisone cream for lesions on torso and bactroban for open sore on back, refilled gabapentin for neurogenic pain -instructed to rtc if not improved in 1-2 weeks

## 2013-10-16 ENCOUNTER — Ambulatory Visit (HOSPITAL_COMMUNITY)
Admission: RE | Admit: 2013-10-16 | Discharge: 2013-10-16 | Disposition: A | Discharge status: Home / Self Care | Payer: No Typology Code available for payment source | Source: Ambulatory Visit | Attending: Family Medicine | Admitting: Family Medicine

## 2013-10-16 DIAGNOSIS — R011 Cardiac murmur, unspecified: Secondary | ICD-10-CM | POA: Insufficient documentation

## 2013-10-16 DIAGNOSIS — I519 Heart disease, unspecified: Secondary | ICD-10-CM

## 2013-10-16 DIAGNOSIS — I10 Essential (primary) hypertension: Secondary | ICD-10-CM | POA: Insufficient documentation

## 2013-10-16 NOTE — Progress Notes (Signed)
  Echocardiogram 2D Echocardiogram has been performed.  Stacy Mccormick, [name] 10/16/2013, 2:43 PM

## 2013-10-17 ENCOUNTER — Ambulatory Visit: Payer: Self-pay

## 2013-10-17 ENCOUNTER — Telehealth: Payer: Self-pay | Admitting: Family Medicine

## 2013-10-17 NOTE — Telephone Encounter (Signed)
Attempted to contact patient regarding recent ECHO results. Everything looks normal! I think the best plan at this point would be to start on the new antihypertensive medication as we discussed at last visit and see whether we can get better control of her BPs. Could we communicate this information to her? Thanks so much [facility]

## 2013-10-17 NOTE — Telephone Encounter (Signed)
Pt informed and agreeable. [name], Stacy Mccormick  

## 2013-10-17 NOTE — Telephone Encounter (Signed)
LMOVM for pt to return call .Skila Rollins Dawn  

## 2014-02-14 ENCOUNTER — Other Ambulatory Visit: Payer: Self-pay | Admitting: Family Medicine

## 2014-06-13 ENCOUNTER — Encounter: Payer: Self-pay | Admitting: Family Medicine

## 2014-06-13 ENCOUNTER — Ambulatory Visit: Payer: Self-pay

## 2014-06-13 DIAGNOSIS — Z0121 Encounter for dental examination and cleaning with abnormal findings: Secondary | ICD-10-CM

## 2014-06-13 NOTE — Progress Notes (Signed)
Will forward to MD to place referral.

## 2014-06-13 NOTE — Progress Notes (Signed)
Pt has the orange card now and would like an referral to a dentist.

## 2014-07-10 ENCOUNTER — Encounter: Payer: Self-pay | Admitting: Family Medicine

## 2014-07-10 ENCOUNTER — Ambulatory Visit (INDEPENDENT_AMBULATORY_CARE_PROVIDER_SITE_OTHER): Payer: Self-pay | Admitting: Family Medicine

## 2014-07-10 VITALS — BP 130/80 | HR 94 | Temp 98.2°F | Wt 137.0 lb

## 2014-07-10 DIAGNOSIS — I10 Essential (primary) hypertension: Secondary | ICD-10-CM

## 2014-07-10 DIAGNOSIS — M25561 Pain in right knee: Secondary | ICD-10-CM

## 2014-07-10 MED ORDER — MELOXICAM 7.5 MG PO TABS: 7.5000 mg | ORAL_TABLET | Freq: Every day | ORAL | Status: DC

## 2014-07-10 MED ORDER — AMLODIPINE BESYLATE 2.5 MG PO TABS: 2.5000 mg | ORAL_TABLET | Freq: Every day | ORAL | Status: DC

## 2014-07-10 NOTE — Assessment & Plan Note (Signed)
Elevated initially --> repeat 130s; pulses not bounding at this time ECHO ok in January 2014 Cont norvasc 2.5 and HCTZ 25 Print out prescription- issue with cost may prohibit compliance

## 2014-07-10 NOTE — Patient Instructions (Signed)
Ms Stacy Mccormick  it was great to see you today!  I am pleased to hear that things are going well for you. I think you have patellar tendonitis Please ice your knee, and keep elevated Try 1-2 weeks of anti-inflammatory medication  You blood pressure looks ok on repeat Medication should be $6 or less at health department   Looking forward to seeing you soon [name] Police, MD

## 2014-07-10 NOTE — Assessment & Plan Note (Signed)
Suspect patellar tendonitis with some intermittent nerve discomfort from compression while seated ICE/ NSAID/ rest Demonstrated stretches and strengthening activities

## 2014-07-10 NOTE — Progress Notes (Signed)
Patient ID: Stacy Mccormick, female   DOB: December 07, 1955, 58 y.o.   MRN: [id number] [facility] Stacy FerrettiMelanie C Christiane Sistare, MD Phone: [phone]  Subjective:  Pt is a 58 y.o F who presents for routine f/up  Interpretor services used: Amharic [place] ID [phone] #HTN: denies CP, SOB, LE edema, headache;  -HCTZ daily and norvasc, reports 100% compliance -ECHO 10/16/2013 Left ventricle: The cavity size was normal. Systolic function was normal. The estimated ejection fraction was in the range of 55% to 60%. Wall motion was normal; there were no regional wall motion abnormalities. Doppler parameters are consistent with abnormal left ventricular relaxation(grade 1 diastolic dysfunction).  #leg weakness -feels that it will give out on her (right sided)- happens after sitting for long periods of time -no trauma or accident  -mostly happening at the knee  -no pain whatsoever, no numbness or tingling in bottom of leg but does have spasms of toe   All systems were reviewed and were negative unless otherwise noted in the HPI  Past Medical History Patient Active Problem List   Diagnosis Date Noted  . Shingles 10/14/2013  . Undiagnosed cardiac murmurs 10/14/2013  . Cervical polyp 01/30/2013  . Allergic rhinitis 01/30/2013  . Hypertriglyceridemia 03/23/2012  . Health care maintenance 03/23/2012  . KNEE PAIN, RIGHT 02/21/2010  . ADNEXAL MASS, LEFT 07/31/2009  . PULMONARY NODULE 07/09/2009  . HYPERTENSION 02/27/2009   Reviewed problem list.  Medications- reviewed and updated Chief complaint-noted No additions to family history Social history- patient is a never smoker  Objective: BP 130/80  Pulse 94  Temp(Src) 98.2 F (36.8 C) (Oral)  Wt 137 lb (62.143 kg) Gen: NAD, alert, cooperative with exam HEENT: NCAT, EOMI, PERRL, wears glasses Neck: FROM, supple, CV: RRR, good S1/S2,no bruits, cap refill <3 Ext: No edema, warm, normal tone, moves UE/LE spontaneously; pinpoint tender  at patella tendon; no effusion appreciated; full ROM, neg mcmurrays, neg anterior/posterior drawer  Neuro: Alert and oriented, No gross deficits, no sensory deficits    Assessment/Plan: See problem based a/p

## 2014-10-19 ENCOUNTER — Other Ambulatory Visit: Payer: Self-pay | Admitting: Family Medicine

## 2014-12-13 ENCOUNTER — Ambulatory Visit: Payer: Self-pay

## 2015-04-29 ENCOUNTER — Ambulatory Visit (INDEPENDENT_AMBULATORY_CARE_PROVIDER_SITE_OTHER): Payer: Self-pay | Admitting: Family Medicine

## 2015-04-29 VITALS — BP 141/71 | HR 93 | Temp 97.7°F | Ht 60.0 in | Wt 133.9 lb

## 2015-04-29 DIAGNOSIS — R519 Headache, unspecified: Secondary | ICD-10-CM

## 2015-04-29 DIAGNOSIS — R51 Headache: Secondary | ICD-10-CM

## 2015-04-29 DIAGNOSIS — R21 Rash and other nonspecific skin eruption: Secondary | ICD-10-CM

## 2015-04-29 MED ORDER — KETOCONAZOLE 2 % EX SHAM: 1.0000 "application " | MEDICATED_SHAMPOO | CUTANEOUS | Status: DC

## 2015-04-29 NOTE — Progress Notes (Signed)
    Subjective   Stacy Mccormick is a 58 y.o. female that presents for a same day visit  1. Hea dache: Symptoms started two weeks ago. Headache located at right occipital headache. Headache described as tight and mostly occuring at night. Headaches are occuring intermittently about twice per week. Improved with Tylenol  3-4 times per week and sometimes rubbing her head. Pain also referred down to her right neck. No recent neck injuries. No eye changes. No nausea or vomiting.   ROS Per HPI  History  Substance Use Topics  . Smoking status: Never Smoker   . Smokeless tobacco: Not on file  . Alcohol Use: Not on file    No Known Allergies  Objective   BP 141/71 mmHg  Pulse 93  Temp(Src) 97.7 F (36.5 C) (Oral)  Ht 5' (1.524 m)  Wt 133 lb 14.4 oz (60.737 kg)  BMI 26.15 kg/m2  General: Well appearing, no distress HEENT: PERRL, no temporal tenderness, EOMI with no pain. No scalp tenderness Musculoskeletal: No neck tenderness, no cervical spine bony tenderness or stepoff. Normal ROM Skin: Patchy rash on base of head that extends to right side of face Neuro: CN intact, Reflexes intact bilaterally and equal, normal strength of U/L extremities.  Assessment and Plan   Meds ordered this encounter  Medications  . ketoconazole (NIZORAL) 2 % shampoo    Sig: Apply 1 application topically 2 (two) times a week.    Dispense:  120 mL    Refill:  0    Headache: unsure of etiology. Could be related to a cervicalgia vs muscular. Tylenol helping and symptoms very intermittent. No red flags for brain mass or temporal arteritis  Continue Tylenol  Red flags discussed  Rash: possibly seborrheic dermatitis  Ketoconazole 2% shampoo

## 2015-04-29 NOTE — Patient Instructions (Addendum)
Thank you for coming to see me today. It was a pleasure. Today we talked about:   Headache: This may be related to the muscles in your neck and head. Keep taking Tylenol intermittently for this pain. If you have any nausea, vision changes, changes in the way your face looks/feels, one sided weakness, please follow-up  Rash: this is most likely seborrheic dermatitis. I will give you some shampoo to use on the areas of rash  Please make an appointment to see Dr. Jordan Likes for follow-up  If you have any questions or concerns, please do not hesitate to call the office at [phone].  Sincerely,  Jacquelin Hawking, MD

## 2015-05-22 ENCOUNTER — Other Ambulatory Visit: Payer: Self-pay | Admitting: *Deleted

## 2015-05-22 MED ORDER — HYDROCHLOROTHIAZIDE 25 MG PO TABS: 25.0000 mg | ORAL_TABLET | Freq: Every day | ORAL | Status: DC

## 2015-06-17 ENCOUNTER — Ambulatory Visit: Payer: Self-pay

## 2015-06-17 ENCOUNTER — Other Ambulatory Visit: Payer: Self-pay | Admitting: Family Medicine

## 2015-06-17 MED ORDER — AMLODIPINE BESYLATE 2.5 MG PO TABS: 2.5000 mg | ORAL_TABLET | Freq: Every day | ORAL | Status: DC

## 2015-06-17 NOTE — Telephone Encounter (Signed)
Pt needs a refill on amlodipine sent to [facility] Dept. Please contact pt once sent. Thank you, Dorothey Baseman, ASA

## 2015-06-18 MED ORDER — AMLODIPINE BESYLATE 2.5 MG PO TABS: 2.5000 mg | ORAL_TABLET | Freq: Every day | ORAL | Status: DC

## 2015-06-18 NOTE — Telephone Encounter (Signed)
Six month supply sent to [facility] pharmacy via fax option.

## 2015-06-18 NOTE — Addendum Note (Signed)
Addended by: Jacquelin Hawking A on: 06/18/2015 07:18 PM   Modules accepted: Orders

## 2015-09-25 ENCOUNTER — Other Ambulatory Visit: Payer: Self-pay | Admitting: *Deleted

## 2015-09-25 MED ORDER — HYDROCHLOROTHIAZIDE 25 MG PO TABS: 25.0000 mg | ORAL_TABLET | Freq: Every day | ORAL | Status: DC

## 2015-09-25 MED ORDER — AMLODIPINE BESYLATE 2.5 MG PO TABS: 2.5000 mg | ORAL_TABLET | Freq: Every day | ORAL | Status: DC

## 2015-09-26 NOTE — Telephone Encounter (Signed)
Called pt. She did not answer and mailbox is full. Will keep trying to reach pt. Sunday SpillersSharon T Saunders, CMA

## 2015-10-18 NOTE — Telephone Encounter (Signed)
Spoke with family member and patient is out of town.  Asked female to have patient call back and schedule an appt for her blood pressure. Deonna Krummel,CMA

## 2015-10-31 ENCOUNTER — Telehealth: Payer: Self-pay | Admitting: *Deleted

## 2015-10-31 NOTE — Telephone Encounter (Signed)
Called patient to offer flu vaccine via [facility], [place], [place] [address], however, number is no longer in service.

## 2015-11-19 ENCOUNTER — Telehealth: Payer: Self-pay | Admitting: *Deleted

## 2015-11-19 NOTE — Telephone Encounter (Signed)
Called patient to offer flu vaccine via [facility], [place], [place] [address], however, VM picked up.  Interpreter left message on patient's voice mail to return call. Fredderick Severance, RN

## 2015-12-16 ENCOUNTER — Other Ambulatory Visit: Payer: Self-pay | Admitting: *Deleted

## 2015-12-16 MED ORDER — AMLODIPINE BESYLATE 2.5 MG PO TABS: 2.5000 mg | ORAL_TABLET | Freq: Every day | ORAL | Status: DC

## 2016-02-20 ENCOUNTER — Ambulatory Visit: Payer: Self-pay

## 2016-02-27 ENCOUNTER — Ambulatory Visit: Payer: Self-pay

## 2016-03-12 ENCOUNTER — Other Ambulatory Visit: Payer: Self-pay | Admitting: *Deleted

## 2016-03-12 MED ORDER — AMLODIPINE BESYLATE 2.5 MG PO TABS: 2.5000 mg | ORAL_TABLET | Freq: Every day | ORAL | Status: DC

## 2016-05-22 ENCOUNTER — Other Ambulatory Visit: Payer: Self-pay | Admitting: *Deleted

## 2016-05-24 NOTE — Telephone Encounter (Signed)
This patient needs to be seen prior to refill. Hasn't been seen since 04/2015. Thanks!

## 2016-06-01 NOTE — Telephone Encounter (Signed)
Pt is out of town/country for at least a month. Not sure when she will be returning. Will call and make an appt when she returns. Sunday SpillersSharon T Gradie Ohm, CMA

## 2016-07-07 ENCOUNTER — Other Ambulatory Visit: Payer: Self-pay | Admitting: *Deleted

## 2016-07-07 NOTE — Telephone Encounter (Signed)
Needs appointment prior to refill. Please have patient make an appointment. Thanks!

## 2016-08-04 ENCOUNTER — Other Ambulatory Visit: Payer: Self-pay | Admitting: *Deleted

## 2016-08-04 NOTE — Telephone Encounter (Signed)
Needs appt prior to additional refills. Please call and schedule her an appt.

## 2016-08-05 NOTE — Telephone Encounter (Signed)
LVM through pacific interpreter ([name]-ID#[id number]) to have pt call office back to inform her about her RX and see about getting her scheduled so we can get her medicine prescribed. If she calls back assist her in getting an appointment. Stacy Mccormick, [name], [place]

## 2016-09-23 NOTE — Telephone Encounter (Signed)
Haven't heard back from pt. Sunday SpillersSharon T Saunders, CMA

## 2017-11-01 ENCOUNTER — Ambulatory Visit (INDEPENDENT_AMBULATORY_CARE_PROVIDER_SITE_OTHER): Payer: Self-pay

## 2017-11-01 DIAGNOSIS — Z23 Encounter for immunization: Secondary | ICD-10-CM

## 2017-11-10 ENCOUNTER — Ambulatory Visit: Payer: Self-pay

## 2017-11-10 ENCOUNTER — Telehealth: Payer: Self-pay | Admitting: Family Medicine

## 2017-11-10 NOTE — Telephone Encounter (Signed)
Pt came in office requesting a referral to the Dentist and for her vision Pt stated she have cataract. Last DOS 11-01-17. Call [phone] if have any questions. Thank you.

## 2017-11-11 NOTE — Telephone Encounter (Signed)
White team, can you please call patient and have her come in to be seen? She hasn't been seen in 2.5 years. You are welcome to offer her to come pick up the dental sheet.

## 2017-11-15 NOTE — Telephone Encounter (Signed)
Pt has appt for a physical on 12/08/17. Ebonee Stober, Maryjo RochesterJessica Dawn, CMA

## 2017-12-08 ENCOUNTER — Encounter: Payer: Self-pay | Admitting: Family Medicine

## 2017-12-08 ENCOUNTER — Other Ambulatory Visit: Payer: Self-pay

## 2017-12-08 ENCOUNTER — Ambulatory Visit (INDEPENDENT_AMBULATORY_CARE_PROVIDER_SITE_OTHER): Payer: Self-pay | Admitting: Family Medicine

## 2017-12-08 VITALS — BP 126/70 | HR 92 | Temp 98.6°F | Ht 60.0 in | Wt 142.0 lb

## 2017-12-08 DIAGNOSIS — Z1211 Encounter for screening for malignant neoplasm of colon: Secondary | ICD-10-CM

## 2017-12-08 DIAGNOSIS — Z1231 Encounter for screening mammogram for malignant neoplasm of breast: Secondary | ICD-10-CM

## 2017-12-08 DIAGNOSIS — R011 Cardiac murmur, unspecified: Secondary | ICD-10-CM

## 2017-12-08 DIAGNOSIS — R911 Solitary pulmonary nodule: Secondary | ICD-10-CM

## 2017-12-08 DIAGNOSIS — E781 Pure hyperglyceridemia: Secondary | ICD-10-CM

## 2017-12-08 DIAGNOSIS — J301 Allergic rhinitis due to pollen: Secondary | ICD-10-CM

## 2017-12-08 DIAGNOSIS — K219 Gastro-esophageal reflux disease without esophagitis: Secondary | ICD-10-CM

## 2017-12-08 DIAGNOSIS — Z1239 Encounter for other screening for malignant neoplasm of breast: Secondary | ICD-10-CM

## 2017-12-08 DIAGNOSIS — I10 Essential (primary) hypertension: Secondary | ICD-10-CM

## 2017-12-08 DIAGNOSIS — M19012 Primary osteoarthritis, left shoulder: Secondary | ICD-10-CM

## 2017-12-08 DIAGNOSIS — Z1159 Encounter for screening for other viral diseases: Secondary | ICD-10-CM

## 2017-12-08 DIAGNOSIS — E785 Hyperlipidemia, unspecified: Secondary | ICD-10-CM

## 2017-12-08 DIAGNOSIS — H269 Unspecified cataract: Secondary | ICD-10-CM

## 2017-12-08 MED ORDER — FLUTICASONE PROPIONATE 50 MCG/ACT NA SUSP: 2.0000 | Freq: Every day | NASAL | 6 refills | Status: DC

## 2017-12-08 MED ORDER — RANITIDINE HCL 150 MG PO TABS: 150.0000 mg | ORAL_TABLET | Freq: Two times a day (BID) | ORAL | 1 refills | Status: DC

## 2017-12-08 MED ORDER — CETIRIZINE HCL 10 MG PO TABS: 10.0000 mg | ORAL_TABLET | Freq: Every day | ORAL | 11 refills | Status: DC

## 2017-12-08 MED ORDER — HYDROCHLOROTHIAZIDE 25 MG PO TABS: 25.0000 mg | ORAL_TABLET | Freq: Every day | ORAL | 1 refills | Status: DC

## 2017-12-08 MED ORDER — AMLODIPINE BESYLATE 2.5 MG PO TABS: 2.5000 mg | ORAL_TABLET | Freq: Every day | ORAL | 1 refills | Status: DC

## 2017-12-08 NOTE — Patient Instructions (Signed)
It was a pleasure to see you today! Thank you for choosing [facility] for your primary care. Stacy Mccormick was seen for blood pressure, physical.   Our plans for today were:  Continue current medicines.   Add cetirizine + flonase for allergies.   Add zantac and tums as needed for reflux/stomach pain.   Use tylenol and massage as needed for shoulder pain.   To keep you healthy, we need to monitor some screening tests. You are due for mammogram. Please schedule these.   You should return to our clinic to see Dr. Chanetta Marshallimberlake in 6 months for blood pressure.   Best,  Dr. Chanetta Marshallimberlake

## 2017-12-08 NOTE — Progress Notes (Signed)
CC: physical   HPI Shoulder pain - present for 1 month. No known injury. No hx of MI. Has tried tylenol, which helps some. Has not tried any topicals. Daughter massages with lotion and this helps. No hx of surgery.   HTN - takes amlodipine and HCTZ. Sometimes at [facility] - 125/70  Allergies - zyrtec in the past, this helped, but hasn't been using it lately.   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 CT recommended 1 year later with   Stomach ache - worse after eating, some help w tums. Hx of GERD.   ROS: denies CP, SOB, abd pain, dysuria, changes in BM.   CC, SH/smoking status, and VS noted  Objective: BP 126/70   Pulse 92   Temp 98.6 F (37 C) (Oral)   Ht 5' (1.524 m)   Wt 142 lb (64.4 kg)   SpO2 97%   BMI 27.73 kg/m  Gen: NAD, alert, cooperative, and pleasant. HEENT: NCAT, EOMI, PERRL CV: RRR, 3/6 systolic murmur Resp: CTAB, no wheezes, non-labored Abd: SNTND, BS present, no guarding or organomegaly Ext: No edema, warm. L shoulder with pain with ROM + mild crepitus.  Neuro: Alert and oriented, Speech clear, No gross deficits  Assessment and plan:  Essential hypertension On low-dose amlodipine and HCTZ.  Offered to discontinue either of these to decrease pill burden, patient and daughter would like to maintain current plan.  Recheck 6 months.  Obtain BMP today.  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  Nodule initially found on chest x-ray in 2010, repeat chest CT in 2011 without change.  Offered patient to reorder CT to recheck nodule, although very low suspicion for anything malignant given no symptoms over the intervening 9 years.  Patient and family would like to decline further imaging at this time.  Allergic rhinitis Resume as needed Zyrtec.  Undiagnosed cardiac murmurs 3 out of 6 systolic murmur on exam.  History of echo to evaluate this, no cause for murmur found on echo.  Given asymptomatic, we will not repeat echo at this time.  OA  (osteoarthritis) of shoulder Patient with subacute history of left shoulder pain.  Improved with Tylenol and her daughter massaging the area nearby.  Pain with resisted arm raise.  Suggested continue Tylenol.  Will consider x-ray if new changes.  No history of surgery to this area.  Hypertriglyceridemia Repeat lipid panel today  GERD (gastroesophageal reflux disease) Resume as needed Zantac.   Orders Placed This Encounter  Procedures  . Fecal occult blood, imunochemical([facility])  . MM Digital Screening    Standing Status:   Future    Standing Expiration Date:   02/08/2019    Order Specific Question:   Reason for Exam (SYMPTOM  OR DIAGNOSIS REQUIRED)    Answer:   screening for breastcancer    Order Specific Question:   Preferred imaging location?    Answer:   [facility]  . CBC  . CMP14+EGFR  . Lipid panel  . Hepatitis C antibody  . HIV antibody  . Ambulatory referral to Ophthalmology    Referral Priority:   Routine    Referral Type:   Consultation    Referral Reason:   Specialty Services Required    Requested Specialty:   Ophthalmology    Number of Visits Requested:   1    Meds ordered this encounter  Medications  . cetirizine (ZYRTEC) 10 MG tablet    Sig: Take 1 tablet (10 mg  total) by mouth daily.    Dispense:  30 tablet    Refill:  11  . fluticasone (FLONASE) 50 MCG/ACT nasal spray    Sig: Place 2 sprays into both nostrils daily.    Dispense:  16 g    Refill:  6  . ranitidine (ZANTAC) 150 MG tablet    Sig: Take 1 tablet (150 mg total) by mouth 2 (two) times daily.    Dispense:  90 tablet    Refill:  1  . amLODipine (NORVASC) 2.5 MG tablet    Sig: Take 1 tablet (2.5 mg total) by mouth daily.    Dispense:  90 tablet    Refill:  1  . hydrochlorothiazide (HYDRODIURIL) 25 MG tablet    Sig: Take 1 tablet (25 mg total) by mouth daily.    Dispense:  90 tablet    Refill:  1    Health Maintenance reviewed - screening HIV and Hep C ordered.  Ralene Ok, MD, PGY2 12/10/2017 8:55 AM

## 2017-12-09 LAB — CBC
HEMATOCRIT: 40.6 % (ref 34.0–46.6)
HEMOGLOBIN: 13.8 g/dL (ref 11.1–15.9)
MCH: 28.6 pg (ref 26.6–33.0)
MCHC: 34 g/dL (ref 31.5–35.7)
MCV: 84 fL (ref 79–97)
Platelets: 254 10*3/uL (ref 150–379)
RBC: 4.83 x10E6/uL (ref 3.77–5.28)
RDW: 14.3 % (ref 12.3–15.4)
WBC: 9.2 10*3/uL (ref 3.4–10.8)

## 2017-12-09 LAB — HIV ANTIBODY (ROUTINE TESTING W REFLEX): HIV Screen 4th Generation wRfx: NONREACTIVE

## 2017-12-09 LAB — CMP14+EGFR
A/G RATIO: 1 — AB (ref 1.2–2.2)
ALBUMIN: 3.9 g/dL (ref 3.6–4.8)
ALK PHOS: 49 IU/L (ref 39–117)
ALT: 26 IU/L (ref 0–32)
AST: 22 IU/L (ref 0–40)
BUN / CREAT RATIO: 10 — AB (ref 12–28)
BUN: 7 mg/dL — ABNORMAL LOW (ref 8–27)
Bilirubin Total: 0.2 mg/dL (ref 0.0–1.2)
CHLORIDE: 102 mmol/L (ref 96–106)
CO2: 23 mmol/L (ref 20–29)
Calcium: 9.6 mg/dL (ref 8.7–10.3)
Creatinine, Ser: 0.68 mg/dL (ref 0.57–1.00)
GFR calc Af Amer: 108 mL/min/{1.73_m2} (ref >59)
GFR calc non Af Amer: 94 mL/min/{1.73_m2} (ref >59)
GLOBULIN, TOTAL: 3.9 g/dL (ref 1.5–4.5)
Glucose: 95 mg/dL (ref 65–99)
POTASSIUM: 3.7 mmol/L (ref 3.5–5.2)
SODIUM: 142 mmol/L (ref 134–144)
Total Protein: 7.8 g/dL (ref 6.0–8.5)

## 2017-12-09 LAB — LIPID PANEL
CHOLESTEROL TOTAL: 196 mg/dL (ref 100–199)
Chol/HDL Ratio: 4.3 ratio (ref 0.0–4.4)
HDL: 46 mg/dL (ref >39)
LDL CALC: 100 mg/dL — AB (ref 0–99)
TRIGLYCERIDES: 249 mg/dL — AB (ref 0–149)
VLDL Cholesterol Cal: 50 mg/dL — ABNORMAL HIGH (ref 5–40)

## 2017-12-09 LAB — HEPATITIS C ANTIBODY: Hep C Virus Ab: <0.1 s/co ratio (ref 0.0–0.9)

## 2017-12-10 DIAGNOSIS — M19019 Primary osteoarthritis, unspecified shoulder: Secondary | ICD-10-CM | POA: Insufficient documentation

## 2017-12-10 DIAGNOSIS — K219 Gastro-esophageal reflux disease without esophagitis: Secondary | ICD-10-CM | POA: Insufficient documentation

## 2017-12-10 NOTE — Assessment & Plan Note (Signed)
Patient with subacute history of left shoulder pain.  Improved with Tylenol and her daughter massaging the area nearby.  Pain with resisted arm raise.  Suggested continue Tylenol.  Will consider x-ray if new changes.  No history of surgery to this area.

## 2017-12-10 NOTE — Assessment & Plan Note (Signed)
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  Nodule initially found on chest x-ray in 2010, repeat chest CT in 2011 without change.  Offered patient to reorder CT to recheck nodule, although very low suspicion for anything malignant given no symptoms over the intervening 9 years.  Patient and family would like to decline further imaging at this time.

## 2017-12-10 NOTE — Assessment & Plan Note (Signed)
Resume as needed Zyrtec.

## 2017-12-10 NOTE — Assessment & Plan Note (Signed)
3 out of 6 systolic murmur on exam.  History of echo to evaluate this, no cause for murmur found on echo.  Given asymptomatic, we will not repeat echo at this time.

## 2017-12-10 NOTE — Assessment & Plan Note (Signed)
On low-dose amlodipine and HCTZ.  Offered to discontinue either of these to decrease pill burden, patient and daughter would like to maintain current plan.  Recheck 6 months.  Obtain BMP today.

## 2017-12-10 NOTE — Assessment & Plan Note (Signed)
Resume as needed Zantac.

## 2017-12-10 NOTE — Assessment & Plan Note (Signed)
Repeat lipid panel today. 

## 2017-12-13 ENCOUNTER — Telehealth: Payer: Self-pay | Admitting: Family Medicine

## 2017-12-13 NOTE — Telephone Encounter (Signed)
Called only listed number to discuss lab results. No answer, generic VM. Left VM to return call to discuss lab results.

## 2018-01-17 ENCOUNTER — Other Ambulatory Visit: Payer: Self-pay | Admitting: Family Medicine

## 2018-01-17 DIAGNOSIS — Z1231 Encounter for screening mammogram for malignant neoplasm of breast: Secondary | ICD-10-CM

## 2018-03-23 ENCOUNTER — Ambulatory Visit: Payer: Self-pay

## 2018-04-08 ENCOUNTER — Ambulatory Visit: Payer: Self-pay | Admitting: Family Medicine

## 2018-04-13 ENCOUNTER — Ambulatory Visit: Payer: Self-pay | Admitting: Family Medicine

## 2018-04-14 ENCOUNTER — Other Ambulatory Visit: Payer: Self-pay

## 2018-04-14 ENCOUNTER — Encounter: Payer: Self-pay | Admitting: Family Medicine

## 2018-04-14 ENCOUNTER — Ambulatory Visit (INDEPENDENT_AMBULATORY_CARE_PROVIDER_SITE_OTHER): Payer: Self-pay | Admitting: Family Medicine

## 2018-04-14 VITALS — BP 148/80 | HR 66 | Temp 98.3°F | Wt 139.0 lb

## 2018-04-14 DIAGNOSIS — R21 Rash and other nonspecific skin eruption: Secondary | ICD-10-CM | POA: Insufficient documentation

## 2018-04-14 DIAGNOSIS — R519 Headache, unspecified: Secondary | ICD-10-CM | POA: Insufficient documentation

## 2018-04-14 DIAGNOSIS — E781 Pure hyperglyceridemia: Secondary | ICD-10-CM

## 2018-04-14 DIAGNOSIS — I10 Essential (primary) hypertension: Secondary | ICD-10-CM

## 2018-04-14 DIAGNOSIS — R0601 Orthopnea: Secondary | ICD-10-CM | POA: Insufficient documentation

## 2018-04-14 DIAGNOSIS — R51 Headache: Secondary | ICD-10-CM

## 2018-04-14 DIAGNOSIS — G44039 Episodic paroxysmal hemicrania, not intractable: Secondary | ICD-10-CM

## 2018-04-14 DIAGNOSIS — R0609 Other forms of dyspnea: Secondary | ICD-10-CM

## 2018-04-14 DIAGNOSIS — J301 Allergic rhinitis due to pollen: Secondary | ICD-10-CM

## 2018-04-14 DIAGNOSIS — H259 Unspecified age-related cataract: Secondary | ICD-10-CM

## 2018-04-14 MED ORDER — AMLODIPINE BESYLATE 2.5 MG PO TABS: 2.5000 mg | ORAL_TABLET | Freq: Every day | ORAL | 1 refills | Status: DC

## 2018-04-14 MED ORDER — HYDROCORTISONE 1 % EX OINT: 1.0000 "application " | TOPICAL_OINTMENT | Freq: Two times a day (BID) | CUTANEOUS | 0 refills | Status: DC

## 2018-04-14 MED ORDER — HYDROCHLOROTHIAZIDE 25 MG PO TABS: 25.0000 mg | ORAL_TABLET | Freq: Every day | ORAL | 1 refills | Status: DC

## 2018-04-14 NOTE — Patient Instructions (Addendum)
Dear Stacy Mccormick,   It was nice to meet you today! I am glad you came in for your concerns. This document serves as a "wrap-up" to all that we discussed today and is listed as follows:    Shortness of breath  Today we discussed all the things that cause her shortness of breath and and patient walked around with you with a pulse ox.  This was to determine whether or not your oxygen saturations drop. You did not become short of breath today, but I would like you to get another echocardiogram to assess your heart function.   Please seek care if you experience chest pain. Please call us for follow up if you experience leg swelling or worsened difficulty breathing.   Your most recent lab results were normal, so I do not believe that this is caused by an anemia.  Headache  I am going to pull some lab tests for you today to rule out more serious conditions.  I we will follow-up with you with the results and then we will set a treatment plan from there.  Rash  I am going to send you a prescription for a more potent and moisturizing hydrocortisone cream.  Please follow-up with Dr. Chanetta Marshall if the rash continues.  Unfortunately, we did not have time to speak more about you mammogram testing. I will share these concerns with Dr. Frederik Schmidt and we will follow up with you.   Thank you for choosing [facility] for your primary care needs and stay well!   Best,   Dr. Genia Hotter Resident Physician [facility] [phone]    Don't forget to sign up for MyChart for instant access to your health profile, labs, orders, upcoming appointments or to contact your provider with questions. Stop at the front desk on the way out for more information about how to sign up!  Food Choices to Lower Your Triglycerides Triglycerides are a type of fat in your blood. High levels of triglycerides can increase the risk of heart disease and stroke. If your triglyceride levels are  high, the foods you eat and your eating habits are very important. Choosing the right foods can help lower your triglycerides. What general guidelines do I need to follow?  Lose weight if you are overweight.  Limit or avoid alcohol.  Fill one half of your plate with vegetables and green salads.  Limit fruit to two servings a day. Choose fruit instead of juice.  Make one fourth of your plate whole grains. Look for the word "whole" as the first word in the ingredient list.  Fill one fourth of your plate with lean protein foods.  Enjoy fatty fish (such as salmon, mackerel, sardines, and tuna) three times a week.  Choose healthy fats.  Limit foods high in starch and sugar.  Eat more home-cooked food and less restaurant, buffet, and fast food.  Limit fried foods.  Cook foods using methods other than frying.  Limit saturated fats.  Check ingredient lists to avoid foods with partially hydrogenated oils (trans fats) in them. What foods can I eat? Grains Whole grains, such as whole wheat or whole grain breads, crackers, cereals, and pasta. Unsweetened oatmeal, bulgur, barley, quinoa, or brown rice. Corn or whole wheat flour tortillas. Vegetables Fresh or frozen vegetables (raw, steamed, roasted, or grilled). Green salads. Fruits All fresh, canned (in natural juice), or frozen fruits. Meat and Other Protein Products Ground beef (85% or leaner), grass-fed beef, or beef trimmed of fat.  Skinless chicken or Mal[place]key. Ground chicken or [place]key. Pork trimmed of fat. All fish and seafood. Eggs. Dried beans, peas, or lentils. Unsalted nuts or seeds. Unsalted canned or dry beans. Dairy Low-fat dairy products, such as skim or 1% milk, 2% or reduced-fat cheeses, low-fat ricotta or cottage cheese, or plain low-fat yogurt. Fats and Oils Tub margarines without trans fats. Light or reduced-fat mayonnaise and salad dressings. Avocado. Safflower, olive, or canola oils. Natural peanut or almond  butter. The items listed above may not be a complete list of recommended foods or beverages. Contact your dietitian for more options. What foods are not recommended? Grains White bread. White pasta. White rice. Cornbread. Bagels, pastries, and croissants. Crackers that contain trans fat. Vegetables White potatoes. Corn. Creamed or fried vegetables. Vegetables in a cheese sauce. Fruits Dried fruits. Canned fruit in light or heavy syrup. Fruit juice. Meat and Other Protein Products Fatty cuts of meat. Ribs, chicken wings, bacon, sausage, bologna, salami, chitterlings, fatback, hot dogs, bratwurst, and packaged luncheon meats. Dairy Whole or 2% milk, cream, half-and-half, and cream cheese. Whole-fat or sweetened yogurt. Full-fat cheeses. Nondairy creamers and whipped toppings. Processed cheese, cheese spreads, or cheese curds. Sweets and Desserts Corn syrup, sugars, honey, and molasses. Candy. Jam and jelly. Syrup. Sweetened cereals. Cookies, pies, cakes, donuts, muffins, and ice cream. Fats and Oils Butter, stick margarine, lard, shortening, ghee, or bacon fat. Coconut, palm kernel, or palm oils. Beverages Alcohol. Sweetened drinks (such as sodas, lemonade, and fruit drinks or punches). The items listed above may not be a complete list of foods and beverages to avoid. Contact your dietitian for more information. This information is not intended to replace advice given to you by your health care provider. Make sure you discuss any questions you have with your health care provider. Document Released: 06/25/2004 Document Revised: 02/13/2016 Document Reviewed: 07/12/2013 Elsevier Interactive Patient Education  2017 [employer]

## 2018-04-14 NOTE — Assessment & Plan Note (Addendum)
Right-sided headache x1 month, approximately 4 times per week, relieved by Tylenol.  Associated with sudden onset ipsilateral blurred vision. History is significant for eye surgery and cataracts. Differential includes acute angular closure glaucoma, temporal arteritis, cluster headache  Will obtain ESR to rule out temporal arteritis.    The further work-up pending these results.

## 2018-04-14 NOTE — Assessment & Plan Note (Addendum)
Patient complains of dyspnea on exertion without chest pain after walking for extended periods of time(e.g. 20 minutes)  and orthopnea x 1 month. Patient reports she had a history of asthma when she as a child.  In March 2019, Dr. Chanetta Marshallimberlake appreciated 3 out of 6 murmur without follow-up as patient was not symptomatic. Patient had echo 2015 done which showed grade 1 diastolic dysfunction otherwise everything was normal.  She complains of one episode of tachycardia that occurred about a month ago and resolved on its own.  Differential includes anemia, heart failure, lung etiology (history of lung nodule and TB).  Most recent lab results from March 2019 show normal CBC.  We will obtain echocardiogram as patient is experiencing orthopnea in the setting of long-standing hypertension and hyperlipidemia  Will wait for echocardiogram results for further work-up.

## 2018-04-14 NOTE — Progress Notes (Signed)
Subjective:  PCP: Sela Hilding, MD Patient ID: MRN [id number]  Date of birth: 1956-02-18  HPI Stacy Mccormick is a 62 y.o. female with past medical history significant for HTN, who presents to clinic with three following complaints.  Patient is accompanied by daughter who prefers to act as her Optometrist.  #1: Dyspnea Patient complains of shortness of breath with more than one block walking, about at 20 minutes.  Symptoms include difficulty breathing, frequent throat clearing and productive cough. Symptoms began 1 month ago, gradually worsening since that time.  Patient denies constant cough, drainage from nose, dry cough and chest pain. Associated symptoms include productive cough with sputum described as clear.  Patient reports traveling to [place] 9 months ago to visit her family.  Marland Kitchen  Appetite has been unchanged. Patient denies any significant weight loss.  Symptoms are exacerbated by any exercise, housework and lying flat. Symptoms are relieved with deep breathing.  #2: Headache Patient complains of headache.  Headaches are unilateral and located on the right side.  She describes him as sharp with no prodromal symptoms times.  They are rapid onset, last a few hours, occur 4 times a week, and are relieved by Tylenol.  She reports that if she does not take Tylenol, radiates to her left side.  Sometimes the headaches last for several days consecutively.  Patient reports that it started a month ago and only recently also associated with sharp ear pain. Not associated with any time of the day, does not awake from sleep and does not interfere with daily function.  Patient denies nausea, vomiting, phonophobia and photophobia.  #3: Rash  Patient complains of rash involving the face and neck. Rash started several years ago.  She previously treated the rash with 0.5% hydrocortisone cream with relief.  She reports that recently the cream has not helped and the rash has also spread down to her neck  and chest.  Patient reports that the first thing she notices is the itching symptoms.  She denies any local discoloration, or changes to her skin.  She reports that after she has itch that become red and bumpy.  Patient reports that the itchiness is not associated with any specific time of the day or any activity.  Patient denies using new soaps, lotions, laundry detergents, foods, medications, plants, insects or animals.   Review of Symptoms:  Constitutional symptoms: + fatigue.  Allergic/immunologic: Denies drug/food/pet allergies Integumentary: + rash, pruritis. Denies color changes, hives. Eyes: + blurring of vision with headaches. + cataracts.  ENT:  Denies tinnitus, sinusitis, recent URI, dysphasia,  Cardiovascular: - Chest pain upon exertion. + one episode of palpatations. Denies thrombosis, PVD, DVT Respiratory: Denies wheezing, hemoptysis Gastrointestinal: Denies nausea, vomiting, diarrhea, constipation, abdominal pain, hematemesis, hematochezia, BRBPR Musculoskeletal: + joint pain, swelling, arthritis.  Neurological: Denies syncope, vertigo, coordination, memory loss, paralysis, tremors, blackouts, ataxia, numbness  Social History:  Patient denies smoking, Alcohol use, drug use.   Objective:  BP (!) 148/80   Pulse 66   Temp 98.3 F (36.8 C) (Oral)   Wt 139 lb (63 kg)   SpO2 96%   BMI 27.15 kg/m   Physical Exam:  BP (!) 148/80   Pulse 66   Temp 98.3 F (36.8 C) (Oral)   Wt 139 lb (63 kg)   SpO2 96%   BMI 27.15 kg/m   Gen: NAD, alert, non-toxic, well-nourished, well-appearing, pleasant HEENT: Normocephaic, atraumatic. PERRLA, clear conjuctiva, no scleral icterus and injection. Normal EOM. + arcus senilis.  Hearing intact  Left > Right. Left TM normal, no fluid. Small perforation of right TM at 6 o'clock.  Neck supple with no LAD, nodules, or gross abnormality.  Nares patent with no discharge.  Maxillary and frontal sinuses nontender to palpation.  Oropharynx without  erythema and lesions.  Tonsils nonswollen and without exudate.   CV: Heart sounds distant.  Regular rate and rhythm. No murmur, gallops, S3, S4 appreciated.  Normal capillary refill bilaterally.  Radial pulses and DPs 1+ bilaterally. No bilateral lower extremity edema. Resp: Clear to auscultation bilaterally.  No wheezing, rales, rhonchi. No increased work of breathing appreciated. Pt ambulates without drop in O2 sats for short period Abd: Nontender and nondistended on palpation to all 4 quadrants.  Positive bowel sounds. Skin: Excoritations appreciated on neck and upper chest. No obvious primary morphology.  MSK: Normal ROM. Normal strength and tone.  Neuro: Cranial nerves II through VI grossly intact. Gait normal.  No obvious abnormal movements.  Pertinent Labs & Imaging:   Lipid Panel March 2019 shows elevated LDL, triglycerides, VLDL.  Echocardiogram in January 2015 shows grade 1 diastolic dysfunction.  Otherwise no abnormalities. Assessment & Plan:   Rash of neck Patient complains of pruritic rash on face that has been present for several years.  Recently she noticed hydrocortisone cream nightly has not relieved her symptoms.  In addition rash is also extended to her neck and chest.  Differential includes dry skin, contact and atopic dermatitis, lichen simplex chronicus.  Patient is of older age and there are no obvious primary morphologies. Excoriations are present. Most likely dry skin.   Started patient on 1.0% hydrocortisone ointment   Headache Right-sided headache x1 month, approximately 4 times per week, relieved by Tylenol.  Associated with sudden onset ipsilateral blurred vision. History is significant for eye surgery and cataracts. Differential includes acute angular closure glaucoma, temporal arteritis, cluster headache  Will obtain ESR to rule out temporal arteritis.    The further work-up pending these results.  DOE (dyspnea on exertion) Patient complains of dyspnea on  exertion without chest pain after walking for extended periods of time(e.g. 20 minutes)  and orthopnea x 1 month. Patient reports she had a history of asthma when she as a child.  In March 2019, Dr. Lindell Noe appreciated 3 out of 6 murmur without follow-up as patient was not symptomatic. Patient had echo 2015 done which showed grade 1 diastolic dysfunction otherwise everything was normal.  She complains of one episode of tachycardia that occurred about a month ago and resolved on its own.  Differential includes anemia, heart failure, lung etiology (history of lung nodule and TB).  Most recent lab results from March 2019 show normal CBC.  We will obtain echocardiogram as patient is experiencing orthopnea in the setting of long-standing hypertension and hyperlipidemia  Will wait for echocardiogram results for further work-up.  Hypertriglyceridemia Patient given information for changing diet as lipid panel was abnormal.  She will try to modify with diet & lifestyle changes until her next follow-up.  HTN: Patient requests med refills. Paper Rx given to patient to fill.   A note for Dr. Lindell Noe (PCP)  Pt reports being denied from the most recent referral for mammogram due to orange card/insurance status. Per [facility]: mammography scholarships are not currently available. They will be available in October and she will be able to apply. Pt had digital diagnostic right limited mammogram on 12/03/2011 for positive screen with no palpable mass. No concern for malignancy or worrisome abnormality per report. Will  defer decision to PCP.   Additionally, patient notes that she has not been contacted by ophthalmology since March 2019 when an order for referral was put in. Will order another internal ophthalmology consult for cataract evaluation.   Zettie Cooley, M.D. [place] 04/14/2018, 9:10 AM

## 2018-04-14 NOTE — Assessment & Plan Note (Signed)
Patient given information for changing diet as lipid panel was abnormal.  She will try to modify with diet & lifestyle changes until her next follow-up.

## 2018-04-14 NOTE — Assessment & Plan Note (Addendum)
Patient complains of pruritic rash on face that has been present for several years.  Recently she noticed hydrocortisone cream nightly has not relieved her symptoms.  In addition rash is also extended to her neck and chest.  Differential includes dry skin, contact and atopic dermatitis, lichen simplex chronicus.  Patient is of older age and there are no obvious primary morphologies. Excoriations are present. Most likely dry skin.   Started patient on 1.0% hydrocortisone ointment

## 2018-04-15 ENCOUNTER — Encounter: Payer: Self-pay | Admitting: Family Medicine

## 2018-04-15 ENCOUNTER — Other Ambulatory Visit: Payer: Self-pay | Admitting: Family Medicine

## 2018-04-15 ENCOUNTER — Ambulatory Visit (HOSPITAL_COMMUNITY)
Admission: RE | Admit: 2018-04-15 | Discharge: 2018-04-15 | Disposition: A | Discharge status: Home / Self Care | Payer: Self-pay | Source: Ambulatory Visit | Attending: Family Medicine | Admitting: Family Medicine

## 2018-04-15 ENCOUNTER — Telehealth: Payer: Self-pay | Admitting: Family Medicine

## 2018-04-15 DIAGNOSIS — R0609 Other forms of dyspnea: Principal | ICD-10-CM

## 2018-04-15 DIAGNOSIS — I10 Essential (primary) hypertension: Secondary | ICD-10-CM | POA: Insufficient documentation

## 2018-04-15 LAB — SEDIMENTATION RATE: SED RATE: 46 mm/h — AB (ref 0–40)

## 2018-04-15 MED ORDER — PREDNISONE 20 MG PO TABS: 60.0000 mg | ORAL_TABLET | Freq: Every day | ORAL | 0 refills | Status: AC

## 2018-04-15 NOTE — Addendum Note (Signed)
Addended by: Genia HotterKIM, Genell Thede E on: 04/15/2018 12:51 PM   Modules accepted: Orders

## 2018-04-15 NOTE — Progress Notes (Signed)
  Echocardiogram 2D Echocardiogram has been performed.  Stacy Mccormick, Stacy Mccormick F 04/15/2018, 4:30 PM

## 2018-04-15 NOTE — Addendum Note (Signed)
Addended by: Melene PlanKIM, RACHEL E on: 04/15/2018 03:12 PM   Modules accepted: Orders

## 2018-04-16 NOTE — Telephone Encounter (Signed)
See phone call communication.

## 2018-04-17 LAB — FECAL OCCULT BLOOD, IMMUNOCHEMICAL: Fecal Occult Bld: NEGATIVE

## 2018-05-02 ENCOUNTER — Telehealth: Payer: Self-pay | Admitting: Family Medicine

## 2018-05-02 NOTE — Telephone Encounter (Signed)
Annice PihJackie can you help with this? Sunday SpillersSharon T Penelope Fittro, CMA

## 2018-05-02 NOTE — Telephone Encounter (Signed)
Pt hasnt heard from neurologist.  Referral was denied by dr Eliberto Ivorydevenshire.  PLease advise

## 2018-05-18 ENCOUNTER — Ambulatory Visit: Payer: Self-pay

## 2018-10-24 ENCOUNTER — Other Ambulatory Visit: Payer: Self-pay | Admitting: Family Medicine

## 2018-10-24 NOTE — Telephone Encounter (Signed)
Pt came in office to request refill of:  Name of Medication(s):  Hydrochlorothiazide, Amlodopine Last date of OV:  04/13/2018 Pharmacy:  [facility]  Will route refill request to Clinic RN.  Discussed with patient policy to call pharmacy for future refills.  Also, discussed refills may take up to 48 hours to approve or deny.  Herma Mering Magtoto

## 2018-10-27 MED ORDER — AMLODIPINE BESYLATE 2.5 MG PO TABS: 2.5000 mg | ORAL_TABLET | Freq: Every day | ORAL | 0 refills | Status: DC

## 2018-10-27 MED ORDER — HYDROCHLOROTHIAZIDE 25 MG PO TABS: 25.0000 mg | ORAL_TABLET | Freq: Every day | ORAL | 0 refills | Status: DC

## 2018-10-27 NOTE — Telephone Encounter (Signed)
Will refill meds as requested, but patient overdue for appt with me. Please call and have patient seen as soon as they can for blood pressure follow up.

## 2018-11-07 ENCOUNTER — Other Ambulatory Visit: Payer: Self-pay | Admitting: Family Medicine

## 2018-11-10 NOTE — Telephone Encounter (Signed)
LVM using [facility][id number] asking pt to call office back to assist her in getting an appointment with her PCP. Lamonte Sakai, April D, [place]

## 2018-11-15 NOTE — Telephone Encounter (Signed)
Called pt number and daughter answered phone and an appointment was scheduled for 11/28/2018. Stacy Mccormick, [name] D, [place]

## 2018-11-28 ENCOUNTER — Other Ambulatory Visit: Payer: Self-pay

## 2018-11-28 ENCOUNTER — Ambulatory Visit (INDEPENDENT_AMBULATORY_CARE_PROVIDER_SITE_OTHER): Payer: Self-pay | Admitting: Family Medicine

## 2018-11-28 ENCOUNTER — Encounter: Payer: Self-pay | Admitting: Family Medicine

## 2018-11-28 VITALS — BP 138/74 | HR 92 | Temp 98.6°F | Ht 60.0 in | Wt 138.6 lb

## 2018-11-28 DIAGNOSIS — G44219 Episodic tension-type headache, not intractable: Secondary | ICD-10-CM

## 2018-11-28 DIAGNOSIS — K219 Gastro-esophageal reflux disease without esophagitis: Secondary | ICD-10-CM

## 2018-11-28 DIAGNOSIS — I10 Essential (primary) hypertension: Secondary | ICD-10-CM

## 2018-11-28 MED ORDER — CETIRIZINE HCL 10 MG PO TABS: 10.0000 mg | ORAL_TABLET | Freq: Every day | ORAL | 11 refills | Status: DC

## 2018-11-28 MED ORDER — AMLODIPINE BESYLATE 2.5 MG PO TABS: 2.5000 mg | ORAL_TABLET | Freq: Every day | ORAL | 3 refills | Status: DC

## 2018-11-28 MED ORDER — HYDROCHLOROTHIAZIDE 25 MG PO TABS: 25.0000 mg | ORAL_TABLET | Freq: Every day | ORAL | 0 refills | Status: DC

## 2018-11-28 MED ORDER — FLUTICASONE PROPIONATE 50 MCG/ACT NA SUSP: 2.0000 | Freq: Every day | NASAL | 6 refills | Status: DC

## 2018-11-28 NOTE — Progress Notes (Signed)
   CC: HTN, headache  HPI  Headache - occasional, not as bad as last visit. Prednisone may or may not have helped. Once or twice per week. Daughter feels like maybe allergies are related. Taking APAP when she has this. Started having headaches in the last few years. No nausea, no photo or phonophobia. On the R temple always. Sometimes also has neck pain and tightness on the R side too. No ear pain.   HTN- taking amlodipine and HCTZ. No concerns. Doesn't check BP at home.   hasn't taking the zantac.   ROS: Denies CP, SOB, abdominal pain, dysuria, changes in BMs.   CC, SH/smoking status, and VS noted  Objective: BP 138/74   Pulse 92   Temp 98.6 F (37 C) (Oral)   Ht 5' (1.524 m)   Wt 138 lb 9.6 oz (62.9 kg)   SpO2 94%   BMI 27.07 kg/m  Gen: NAD, alert, cooperative, and pleasant. HEENT: NCAT, EOMI, PERRL CV: RRR, no murmur Resp: CTAB, no wheezes, non-labored Ext: No edema, warm Neuro: Alert and oriented, Speech clear, No gross deficits. CN II-XII grossly intact.   Assessment and plan:  Essential hypertension Continue HCTZ and amlodipine, check BMP today.  GERD (gastroesophageal reflux disease) Counseled to use Pepcid instead of Zantac due to recall.  Headache There was a prior concern for temporal arteritis, however patient was never able to make it to the rheumatology appointment and I suspect this diagnosis is less likely given the chronicity of the headache and its episodic nature.  Sounds like it may be more migrainous.  We will trial Tylenol and hydration as well as some treatment for neck tension with heat and massage.  If it persists it may be reasonable to switch her blood pressure medicine to propranolol and DC hydrochlorothiazide for prevention.  Daughter will call if headaches are not helped by the above-mentioned plan.   Orders Placed This Encounter  Procedures  . Basic metabolic panel    Meds ordered this encounter  Medications  . amLODipine (NORVASC) 2.5  MG tablet    Sig: Take 1 tablet (2.5 mg total) by mouth daily.    Dispense:  90 tablet    Refill:  3  . cetirizine (ZYRTEC) 10 MG tablet    Sig: Take 1 tablet (10 mg total) by mouth daily.    Dispense:  30 tablet    Refill:  11  . hydrochlorothiazide (HYDRODIURIL) 25 MG tablet    Sig: Take 1 tablet (25 mg total) by mouth daily.    Dispense:  90 tablet    Refill:  0  . fluticasone (FLONASE) 50 MCG/ACT nasal spray    Sig: Place 2 sprays into both nostrils daily.    Dispense:  16 g    Refill:  6     Loni Muse, MD, PGY3 11/29/2018 2:45 PM

## 2018-11-28 NOTE — Patient Instructions (Signed)
It was a pleasure to see you today! Thank you for choosing [facility] for your primary care. Stacy Mccormick was seen for checkup .   Our plans for today were:  Use pepcid instead of zantac for the acid symptoms.   Call me if using heat and massage to the neck doesn't help the headache. We can try switching her HCTZ to propranolol for migraine prevention.   Best,  Dr. Chanetta Marshall

## 2018-11-29 LAB — BASIC METABOLIC PANEL
BUN/Creatinine Ratio: 16 (ref 12–28)
BUN: 11 mg/dL (ref 8–27)
CALCIUM: 10.3 mg/dL (ref 8.7–10.3)
CO2: 24 mmol/L (ref 20–29)
CREATININE: 0.69 mg/dL (ref 0.57–1.00)
Chloride: 98 mmol/L (ref 96–106)
GFR calc Af Amer: 107 mL/min/{1.73_m2} (ref >59)
GFR, EST NON AFRICAN AMERICAN: 93 mL/min/{1.73_m2} (ref >59)
GLUCOSE: 117 mg/dL — AB (ref 65–99)
Potassium: 3.6 mmol/L (ref 3.5–5.2)
Sodium: 139 mmol/L (ref 134–144)

## 2018-11-29 NOTE — Assessment & Plan Note (Signed)
Continue HCTZ and amlodipine, check BMP today.

## 2018-11-29 NOTE — Assessment & Plan Note (Signed)
There was a prior concern for temporal arteritis, however patient was never able to make it to the rheumatology appointment and I suspect this diagnosis is less likely given the chronicity of the headache and its episodic nature.  Sounds like it may be more migrainous.  We will trial Tylenol and hydration as well as some treatment for neck tension with heat and massage.  If it persists it may be reasonable to switch her blood pressure medicine to propranolol and DC hydrochlorothiazide for prevention.  Daughter will call if headaches are not helped by the above-mentioned plan.

## 2018-11-29 NOTE — Assessment & Plan Note (Signed)
Counseled to use Pepcid instead of Zantac due to recall.

## 2018-11-30 ENCOUNTER — Telehealth: Payer: Self-pay | Admitting: *Deleted

## 2018-11-30 NOTE — Telephone Encounter (Signed)
-----   Message from Garth Bigness, MD sent at 11/29/2018  1:34 PM EDT ----- [facility] team, please let the daughter know that her labs look normal.

## 2018-11-30 NOTE — Telephone Encounter (Signed)
LVM to call office to inform pt daughter of pts results. Lamonte Sakai, April D, [place]

## 2018-11-30 NOTE — Telephone Encounter (Signed)
LVM to call office to inform her of below. April Zimmerman Rumple, CMA  

## 2019-01-11 ENCOUNTER — Telehealth: Payer: Self-pay | Admitting: *Deleted

## 2019-01-11 NOTE — Telephone Encounter (Signed)
Daughter lm on nurse line stating that her mom's heart is "going fast and she is more tired than normal"  Called daughter back.  Mom is c/o of the above (but no fever/SOB/Cough).  Given her cardiac history would recommend she be seen, daughter agreeable.  Appt made for tomorrow pm.  Jone Baseman, CMA

## 2019-01-12 ENCOUNTER — Encounter: Payer: Self-pay | Admitting: Family Medicine

## 2019-01-12 ENCOUNTER — Ambulatory Visit (HOSPITAL_COMMUNITY)
Admission: RE | Admit: 2019-01-12 | Discharge: 2019-01-12 | Disposition: A | Discharge status: Home / Self Care | Payer: Self-pay | Source: Ambulatory Visit | Attending: Family Medicine | Admitting: Family Medicine

## 2019-01-12 ENCOUNTER — Other Ambulatory Visit: Payer: Self-pay

## 2019-01-12 ENCOUNTER — Ambulatory Visit (INDEPENDENT_AMBULATORY_CARE_PROVIDER_SITE_OTHER): Payer: Self-pay | Admitting: Family Medicine

## 2019-01-12 VITALS — BP 156/90 | HR 75 | Temp 98.3°F | Ht 60.0 in | Wt 140.4 lb

## 2019-01-12 DIAGNOSIS — R000 Tachycardia, unspecified: Secondary | ICD-10-CM | POA: Insufficient documentation

## 2019-01-12 DIAGNOSIS — I10 Essential (primary) hypertension: Secondary | ICD-10-CM

## 2019-01-12 DIAGNOSIS — Z7901 Long term (current) use of anticoagulants: Secondary | ICD-10-CM | POA: Insufficient documentation

## 2019-01-12 HISTORY — DX: Tachycardia, unspecified: R00.0

## 2019-01-12 MED ORDER — AMLODIPINE BESYLATE 5 MG PO TABS: 5.0000 mg | ORAL_TABLET | Freq: Every day | ORAL | 3 refills | Status: DC

## 2019-01-12 NOTE — Progress Notes (Signed)
     Subjective: Chief Complaint  Patient presents with  . Tachycardia     HPI: Stacy Mccormick is a 63 y.o. presenting to clinic today to discuss the following:  "Heat beating fast"  Patient presents to clinic today due to feeling her heart beat fast. She states this has been occurring for one week at multiple times per day. It doesn't last long but patient has difficult time quantifying how long it lasts. She is not having and does not have chest pain with these episodes. They occur at rest and on exertion. She has some associated SOB with these episodes but states "that has been going on for a long time". She denies fever, chills, nausea, vomiting, pain that radiates out of the chest, abdominal pain, diarrhea, or constipation.  Of note, initial BP was high but came down to 156/90 on recheck.  ROS noted in HPI.   Past Medical, Surgical, Social, and Family History Reviewed & Updated per EMR.   Pertinent Historical Findings include:   Social History   Tobacco Use  Smoking Status Never Smoker  Smokeless Tobacco Never Used    Objective: BP (!) 182/100   Pulse 75   Temp 98.3 F (36.8 C) (Oral)   Ht 5' (1.524 m)   Wt 140 lb 6.4 oz (63.7 kg)   SpO2 98%   BMI 27.42 kg/m  Vitals and nursing notes reviewed  Physical Exam Gen: Alert and Oriented x 3, NAD HEENT: Normocephalic, atraumatic, PERRLA, EOMI Neck: No JVD CV: RRR, no murmurs, normal S1, S2 split, S3 gallop present Resp: CTAB, no wheezing, rales, or rhonchi, comfortable work of breathing Ext: no clubbing, cyanosis, or edema Skin: warm, dry, intact, no rashes  LABS EKG: normal sinus rhythm, possible atrial enlargement  Assessment/Plan:  Essential hypertension Increased amlodipine from 2.5mg  to 5mg  daily due to HTN being above goal. - F/u in one month  Tachycardia Cause of her tachycardia is unclear. Differential remains broad at this time. More reassured that EKG is normal but I did hear a gallop. No signs  of fluid overload that would make me think CHF however that is a possibility. If symptoms worsen would do further work up including BNP and CXR. - Meant to get TSH to see if thyroid is contributing; obtain at next visit if still having palpitations - Referral to Cardiology for holter monitor, possible stress test, and/or echo  PATIENT EDUCATION PROVIDED: See AVS    Diagnosis and plan along with any newly prescribed medication(s) were discussed in detail with this patient today. The patient verbalized understanding and agreed with the plan. Patient advised if symptoms worsen return to clinic or ER.    Orders Placed This Encounter  Procedures  . EKG 12-Lead    Meds ordered this encounter  Medications  . amLODipine (NORVASC) 5 MG tablet    Sig: Take 1 tablet (5 mg total) by mouth daily.    Dispense:  90 tablet    Refill:  3     Tim Karen Chafe, DO 01/12/2019, 2:53 PM PGY-2 [facility]

## 2019-01-12 NOTE — Patient Instructions (Signed)
It was great to meet you today! Thank you for letting me participate in your care!  Today, we discussed your recent palpitations and high blood pressure. I am referring you to a Cardiologist for further evaluation. They may want to do a stress test and/or give you a holter monitor. I will attempt to get better control of you high blood pressure by increasing your amlodipine from 2.5mg  to 5mg  daily.  This could be stress related. Please try to find ways to relax and not become over stressed.  Be well, Jules Schick, DO PGY-2, [facility]

## 2019-01-21 ENCOUNTER — Other Ambulatory Visit: Payer: Self-pay | Admitting: Family Medicine

## 2019-01-23 ENCOUNTER — Encounter: Payer: Self-pay | Admitting: Family Medicine

## 2019-01-23 NOTE — Assessment & Plan Note (Signed)
Cause of her tachycardia is unclear. Differential remains broad at this time. More reassured that EKG is normal but I did hear a gallop. No signs of fluid overload that would make me think CHF however that is a possibility. If symptoms worsen would do further work up including BNP and CXR. - Meant to get TSH to see if thyroid is contributing; obtain at next visit if still having palpitations - Referral to Cardiology for holter monitor, possible stress test, and/or echo

## 2019-01-23 NOTE — Assessment & Plan Note (Signed)
Increased amlodipine from 2.5mg  to 5mg  daily due to HTN being above goal. - F/u in one month

## 2019-01-27 ENCOUNTER — Telehealth: Payer: Self-pay | Admitting: Cardiology

## 2019-01-27 NOTE — Telephone Encounter (Signed)
° °  Patient has been called twice to schedule appointment, messages left on vcml

## 2019-01-27 NOTE — Telephone Encounter (Signed)
-----   Message from Allayne Butcher, [place] sent at 01/26/2019  1:45 PM EDT ----- Regarding: New Pt Telehealth visit In new pt referral queue. prior attempt to schedule appt. please try again. ok for telehealh visit in 1-2 weeks for palpitations.

## 2019-02-28 ENCOUNTER — Other Ambulatory Visit: Payer: Self-pay

## 2019-02-28 MED ORDER — AMLODIPINE BESYLATE 5 MG PO TABS: 5.0000 mg | ORAL_TABLET | Freq: Every day | ORAL | 3 refills | Status: DC

## 2019-04-18 ENCOUNTER — Ambulatory Visit: Payer: Self-pay | Admitting: Cardiology

## 2019-04-18 ENCOUNTER — Telehealth: Payer: Self-pay | Admitting: Cardiology

## 2019-04-18 NOTE — Telephone Encounter (Signed)
LVM, asking pt to call back to be pre-screened for COVID-19. °

## 2019-04-19 ENCOUNTER — Other Ambulatory Visit: Payer: Self-pay | Admitting: Family Medicine

## 2019-04-19 NOTE — Telephone Encounter (Signed)
Pt calling to request refill of: blood pressure med  Name of Medication(s):  Amlodipine Last date of OV:  01/12/2019 Pharmacy:  [facility]  Will route refill request to Clinic RN.  Discussed with patient policy to call pharmacy for future refills.  Also, discussed refills may take up to 48 hours to approve or deny.  Stacy Mccormick

## 2019-04-21 MED ORDER — AMLODIPINE BESYLATE 5 MG PO TABS: 5.0000 mg | ORAL_TABLET | Freq: Every day | ORAL | 3 refills | Status: DC

## 2019-04-21 MED ORDER — HYDROCHLOROTHIAZIDE 25 MG PO TABS: 25.0000 mg | ORAL_TABLET | Freq: Every day | ORAL | 0 refills | Status: DC

## 2019-05-24 ENCOUNTER — Other Ambulatory Visit: Payer: Self-pay

## 2019-05-24 ENCOUNTER — Telehealth: Payer: Self-pay | Admitting: Family Medicine

## 2019-05-24 MED ORDER — HYDROCHLOROTHIAZIDE 25 MG PO TABS: 25.0000 mg | ORAL_TABLET | Freq: Every day | ORAL | 0 refills | Status: DC

## 2019-05-24 MED ORDER — AMLODIPINE BESYLATE 5 MG PO TABS: 5.0000 mg | ORAL_TABLET | Freq: Every day | ORAL | 3 refills | Status: DC

## 2019-05-24 NOTE — Telephone Encounter (Signed)
Can we schedule this patient an appointment? I've never met her before and her last BP was pretty elevated.

## 2019-05-25 NOTE — Telephone Encounter (Signed)
LVM to call office back, if she calls please assist her in getting an appointment scheduled. Ellamae Lybeck Zimmerman Rumple, CMA

## 2019-06-02 NOTE — Telephone Encounter (Signed)
2nd attempt to contact pt.  LVM to call office back, please assist in getting an appointment scheduled.Massiah Minjares Zimmerman Rumple, CMA

## 2019-06-02 NOTE — Telephone Encounter (Signed)
Pt called back and scheduled an appointment with schedulers.Ardith Lewman Zimmerman Rumple, CMA

## 2019-06-06 ENCOUNTER — Other Ambulatory Visit: Payer: Self-pay

## 2019-06-06 DIAGNOSIS — Z20822 Contact with and (suspected) exposure to covid-19: Secondary | ICD-10-CM

## 2019-06-08 ENCOUNTER — Telehealth: Payer: Self-pay | Admitting: *Deleted

## 2019-06-08 LAB — NOVEL CORONAVIRUS, NAA: SARS-CoV-2, NAA: NOT DETECTED

## 2019-06-08 NOTE — Telephone Encounter (Signed)
Pt returned call for covid-19 test results. Negative results given to her with verbal understanding. She had been exposed to a family member but that person is in the hospital now having a baby.  Pt stated neither she or this family member had symptoms.

## 2019-06-19 ENCOUNTER — Ambulatory Visit (INDEPENDENT_AMBULATORY_CARE_PROVIDER_SITE_OTHER): Payer: Self-pay | Admitting: Family Medicine

## 2019-06-19 ENCOUNTER — Other Ambulatory Visit: Payer: Self-pay

## 2019-06-19 ENCOUNTER — Encounter: Payer: Self-pay | Admitting: Family Medicine

## 2019-06-19 DIAGNOSIS — G5601 Carpal tunnel syndrome, right upper limb: Secondary | ICD-10-CM

## 2019-06-19 DIAGNOSIS — M25561 Pain in right knee: Secondary | ICD-10-CM

## 2019-06-19 DIAGNOSIS — I10 Essential (primary) hypertension: Secondary | ICD-10-CM

## 2019-06-19 DIAGNOSIS — R21 Rash and other nonspecific skin eruption: Secondary | ICD-10-CM

## 2019-06-19 DIAGNOSIS — M65312 Trigger thumb, left thumb: Secondary | ICD-10-CM

## 2019-06-19 MED ORDER — WRIST/THUMB SPLINT/RIGHT SMALL MISC: 1.0000 | Freq: Every day | 0 refills | Status: AC

## 2019-06-19 MED ORDER — WRIST/THUMB SPLINT/LEFT SMALL MISC: 1.0000 | Freq: Every day | 0 refills | Status: AC

## 2019-06-19 MED ORDER — KETOCONAZOLE 1 % EX SHAM: MEDICATED_SHAMPOO | CUTANEOUS | 1 refills | Status: DC

## 2019-06-19 NOTE — Progress Notes (Signed)
   [facility] Phone: [phone]     Stacy Mccormick - 63 y.o. female MRN [id number]  Date of birth: 1956-02-04  Subjective:   cc: BP, right lower extremity pain, wrist and thumb pain.   HPI:  BP: taking both hctz and amlodipine. Tolerating well. No leg swelling before she fell.   Fall: three weeks ago, fell in the kitchen. She stepped on something and tripped. Witnessed by daughter  Sallye Lat on her right side.  Pain in the knee , sometimes in the right nip since that time.  Appears to be improving. Did not hit head, did not lose consciousness.   Wrist pain: right side. Not every day.  If she uses it a lot it hurts.  Puts a wrap on it when it hurts, which helps.   Takes tylenol up to three times a week.  Pain existed before her fall.   Left thumb pain: thumb gets 'stuck' in flexed position.  This has been going on for a Couple of months. Has intermittent soreness in that thumb as well.      Rash of face and neck:  Has been occurring for several years.  Was taking mild steroid cream for this but states it does not help her.   ROS: See HPI for pertinent positives and negatives  Social history- patient is a non smoker  Objective:   BP 130/82   Pulse 84   Wt 142 lb 12.8 oz (64.8 kg)   SpO2 99%   BMI 27.89 kg/m  Gen: NAD, alert and oriented, cooperative with exam HEENT: NCAT, EOMI, MMM Neck: FROM, supple, no masses CV: normal rate, regular rhythm. No murmurs, no rubs.  Resp: LCTAB, no wheezes, crackles. normal work of breathing GI: nontender to palpation, BS present, no guarding or organomegaly Msk: No edema, warm, normal tone, moves UE/LE spontaneously Neuro: CN II-XII grossly intact. no gross deficits Skin: No rashes, no lesions Psych: Appropriate behavior  Assessment/Plan:   Essential hypertension Well controlled on current meds.  130/82 in office today.   - continue current meds  Rash of neck Has been treated with topical steroids in the past  with no improvement.  Does appear to be small annular rash on chest below right collar bone. Possible this is a fungal infection, possibly tinea versicolor.  - apply ketoconazole shampoo daily   KNEE PAIN, RIGHT Pain in right hip and knee after falling on it three weeks ago.  Pain is improving. Does not impede gait. No abnormal findings Physical exam other than some tenderness to palpation.  - alternate tylenol and ibuprofen, ice when resting.  - f/u in one month if not improved.   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  phalen sign positive.  - nocturnal thumb spica cast.  - discussed surgical alternatives with patient  Trigger finger of left thumb Has been occurring for a few months.  Gets 'stuck' in the flexed positition and she has to pull it out.   - nocturnal thumb spica cast   Clemetine Marker, MD PGY-2 [facility]

## 2019-06-19 NOTE — Patient Instructions (Addendum)
For your hip and knee pain you can alternate Tylenol and ibuprofen every 4 hours as needed.  Also put ice on it when resting.  This should improve over the next month.  If it does not improve in 1 month please make another appointment with me.  Please continue taking your blood pressure medicines as prescribed.  Please wear these thumb splints on your wrists every night.  You can take them off during the day.  If this does not improve your symptoms in the next month, we can consider an injection or other treatments.  Please apply the antifungal shampoo once a day and wash off after a few minutes.   Have a great day,  Clemetine Marker, MD

## 2019-06-20 DIAGNOSIS — G5601 Carpal tunnel syndrome, right upper limb: Secondary | ICD-10-CM | POA: Insufficient documentation

## 2019-06-20 DIAGNOSIS — M65312 Trigger thumb, left thumb: Secondary | ICD-10-CM | POA: Insufficient documentation

## 2019-06-20 NOTE — Assessment & Plan Note (Signed)
Has been treated with topical steroids in the past with no improvement.  Does appear to be small annular rash on chest below right collar bone. Possible this is a fungal infection, possibly tinea versicolor.  - apply ketoconazole shampoo daily

## 2019-06-20 NOTE — Assessment & Plan Note (Signed)
Has been occurring for a few months.  Gets 'stuck' in the flexed positition and she has to pull it out.   - nocturnal thumb spica cast

## 2019-06-20 NOTE — Assessment & Plan Note (Signed)
Chronic Pain in right wrist that worsens with use.  phalen sign positive.  - nocturnal thumb spica cast.  - discussed surgical alternatives with patient

## 2019-06-20 NOTE — Assessment & Plan Note (Signed)
Well controlled on current meds.  130/82 in office today.   - continue current meds

## 2019-06-20 NOTE — Assessment & Plan Note (Signed)
Pain in right hip and knee after falling on it three weeks ago.  Pain is improving. Does not impede gait. No abnormal findings Physical exam other than some tenderness to palpation.  - alternate tylenol and ibuprofen, ice when resting.  - f/u in one month if not improved.

## 2019-08-29 ENCOUNTER — Other Ambulatory Visit: Payer: Self-pay | Admitting: Family Medicine

## 2019-08-30 MED ORDER — HYDROCHLOROTHIAZIDE 25 MG PO TABS: 25.0000 mg | ORAL_TABLET | Freq: Every day | ORAL | 0 refills | Status: DC

## 2019-08-30 NOTE — Addendum Note (Signed)
Addended by: Christen Bame D on: 08/30/2019 09:24 AM   Modules accepted: Orders

## 2019-08-30 NOTE — Telephone Encounter (Signed)
Transmission failed. Resent. Reice Bienvenue, CMA  

## 2019-12-05 ENCOUNTER — Other Ambulatory Visit: Payer: Self-pay

## 2019-12-05 MED ORDER — HYDROCHLOROTHIAZIDE 25 MG PO TABS: 25.0000 mg | ORAL_TABLET | Freq: Every day | ORAL | 0 refills | Status: DC

## 2019-12-06 ENCOUNTER — Other Ambulatory Visit: Payer: Self-pay | Admitting: Family Medicine

## 2020-02-12 ENCOUNTER — Other Ambulatory Visit: Payer: Self-pay | Admitting: Family Medicine

## 2020-06-06 ENCOUNTER — Other Ambulatory Visit: Payer: Self-pay | Admitting: Family Medicine

## 2020-08-23 ENCOUNTER — Other Ambulatory Visit: Payer: Self-pay | Admitting: Family Medicine

## 2020-10-07 ENCOUNTER — Ambulatory Visit: Payer: Self-pay | Admitting: Family Medicine

## 2020-10-10 ENCOUNTER — Ambulatory Visit (INDEPENDENT_AMBULATORY_CARE_PROVIDER_SITE_OTHER): Payer: Medicaid Other | Admitting: Family Medicine

## 2020-10-10 ENCOUNTER — Other Ambulatory Visit: Payer: Self-pay

## 2020-10-10 ENCOUNTER — Encounter: Payer: Self-pay | Admitting: Family Medicine

## 2020-10-10 VITALS — BP 130/72 | HR 95 | Wt 140.0 lb

## 2020-10-10 DIAGNOSIS — R202 Paresthesia of skin: Secondary | ICD-10-CM | POA: Diagnosis not present

## 2020-10-10 DIAGNOSIS — Z23 Encounter for immunization: Secondary | ICD-10-CM | POA: Diagnosis not present

## 2020-10-10 DIAGNOSIS — R21 Rash and other nonspecific skin eruption: Secondary | ICD-10-CM

## 2020-10-10 DIAGNOSIS — H269 Unspecified cataract: Secondary | ICD-10-CM

## 2020-10-10 DIAGNOSIS — M542 Cervicalgia: Secondary | ICD-10-CM | POA: Diagnosis not present

## 2020-10-10 DIAGNOSIS — M25559 Pain in unspecified hip: Secondary | ICD-10-CM

## 2020-10-10 DIAGNOSIS — I10 Essential (primary) hypertension: Secondary | ICD-10-CM | POA: Diagnosis not present

## 2020-10-10 MED ORDER — AMLODIPINE BESYLATE 5 MG PO TABS: 5.0000 mg | ORAL_TABLET | Freq: Every day | ORAL | 1 refills | Status: DC

## 2020-10-10 MED ORDER — HYDROCHLOROTHIAZIDE 25 MG PO TABS: 25.0000 mg | ORAL_TABLET | Freq: Every day | ORAL | 1 refills | Status: DC

## 2020-10-10 MED ORDER — DICLOFENAC SODIUM 1 % EX GEL: 2.0000 g | Freq: Four times a day (QID) | CUTANEOUS | 1 refills | Status: DC

## 2020-10-10 NOTE — Progress Notes (Signed)
    SUBJECTIVE:   CHIEF COMPLAINT / HPI:   HTN: Patient taking her amlodipine and hydrochlorothiazide.  No issues with either medication.  Paresthesia of right upper extremity: Patient has been wearing her wrist splint at night as directed at last visit, but this is not helped.  Now she states that she has a pain in the right side of her neck that radiates down the right arm and causes a numbness and "shooting pain" in the right hand.  Patient has been taking Tylenol for the pain but this does not help with the arm pain.  She takes with as milligrams twice a day.  Hip pain: Patient has pain in the posterior right hip.  Describes as a dull aching pain.  When she takes Tylenol this helps with the pain.  Rash on face and neck: Patient states the shampoo that she was told to use at last visit improved rash, but when she ran out of the shampoo the rash came back.  She would like to see a dermatologist for this.  Patient wants to know if she can get a referral to the ophthalmologist.  She has had cataract surgery in 1 eye but not the other one.  This was several years ago and she does not member who the ophthalmologist was.   PERTINENT  PMH / PSH: HTN, rash  OBJECTIVE:   BP 130/72   Pulse 95   Wt 140 lb (63.5 kg)   SpO2 96%   BMI 27.34 kg/m   General: Alert, oriented.  Appears older than stated age.  Accompanied by daughter. CV: Regular rate and rhythm, no murmurs Pulmonary: Lungs clear to auscultation bilaterally: MSK: Patient has tenderness of the cervical paraspinal muscles on the right side.  Normal Neer and Hawkins tests.  Normal range of motion of the shoulders.  Patient has tenderness to palpation behind the right greater trochanter.  FABER and FADIR test positive for pain in same location. Skin: No rash appreciated on the neck.  ASSESSMENT/PLAN:   Essential hypertension Well-controlled on current medications.  Continue amlodipine and HCTZ.  Wrote prescription for 180 days at a  time given that patient is planning on traveling to [place] later this year.  Paresthesia of right upper extremity Initially thought to be carpal tunnel, patient now experiencing paresthesias along the entire right arm originating from her cervical spine.  No weakness.  We will get cervical x-rays for further evaluation.  Hip pain Possibly arthritis or SI joint dysfunction.  May be trochanteric bursitis given her complaint of not being able to lay on her right side anymore.  We will get x-ray of the pelvis.  Advised patient she can continue Tylenol.  Also prescribed Voltaren gel.  Rash of neck Rash not appreciated on exam today, patient complains of it returning after stopping her antifungal shampoo.  Advised patient she can continue to use this shampoo which is available over-the-counter.  Also will send in referral for dermatology.  Advised patient she may need to stop using the shampoo prior to the dermatologist appointment so that they may assess the rash.  Cataract of both eyes Patient had previous surgery of 1 eye for cataracts.  This was many years ago and patient does not remember who her ophthalmologist was.  Would like referral to ophthalmologist for cataract surgery of her other eye.     Sandre Kitty, MD [facility]

## 2020-10-10 NOTE — Patient Instructions (Signed)
It was nice to see you today,   I have refilled your prescriptions.  I tried to make it for 180 days at time so you won't run out when overseas.  Please let me know if there are any issues with this.   I have ordered x rays of your hip an neck.  You can get this done at any time Monday through Friday during regular business hours at the [facility].    I will put in a referral for the ophthalmologist and dermatologist. Please call us in one week if you have not heard from them.    Have a great day,   Frederic Jericho, MD

## 2020-10-10 NOTE — Progress Notes (Deleted)
Numbness and shoting pain in the right hand.  Ash been wearing the   Numbnes. In the right hand.  Pain in cervical spine.    Taking tylenol for the pain.  Takes 1000mg  twice a day. Helps with the hip pain.     Taking amlodipine and hctz for bp.    Rash on face and neck - shampooo made it better but came back after she stopped.  Ran out of it.

## 2020-10-11 DIAGNOSIS — G8929 Other chronic pain: Secondary | ICD-10-CM | POA: Insufficient documentation

## 2020-10-11 DIAGNOSIS — H269 Unspecified cataract: Secondary | ICD-10-CM | POA: Insufficient documentation

## 2020-10-11 DIAGNOSIS — R202 Paresthesia of skin: Secondary | ICD-10-CM | POA: Insufficient documentation

## 2020-10-11 DIAGNOSIS — M25559 Pain in unspecified hip: Secondary | ICD-10-CM | POA: Insufficient documentation

## 2020-10-11 NOTE — Assessment & Plan Note (Signed)
Possibly arthritis or SI joint dysfunction.  May be trochanteric bursitis given her complaint of not being able to lay on her right side anymore.  We will get x-ray of the pelvis.  Advised patient she can continue Tylenol.  Also prescribed Voltaren gel.

## 2020-10-11 NOTE — Assessment & Plan Note (Signed)
Rash not appreciated on exam today, patient complains of it returning after stopping her antifungal shampoo.  Advised patient she can continue to use this shampoo which is available over-the-counter.  Also will send in referral for dermatology.  Advised patient she may need to stop using the shampoo prior to the dermatologist appointment so that they may assess the rash.

## 2020-10-11 NOTE — Assessment & Plan Note (Signed)
Initially thought to be carpal tunnel, patient now experiencing paresthesias along the entire right arm originating from her cervical spine.  No weakness.  We will get cervical x-rays for further evaluation.

## 2020-10-11 NOTE — Assessment & Plan Note (Signed)
Patient had previous surgery of 1 eye for cataracts.  This was many years ago and patient does not remember who her ophthalmologist was.  Would like referral to ophthalmologist for cataract surgery of her other eye.

## 2020-10-11 NOTE — Assessment & Plan Note (Addendum)
Well-controlled on current medications.  Continue amlodipine and HCTZ.  Wrote prescription for 180 days at a time given that patient is planning on traveling to [place] later this year.

## 2020-10-17 ENCOUNTER — Ambulatory Visit
Admission: RE | Admit: 2020-10-17 | Discharge: 2020-10-17 | Disposition: A | Discharge status: Home / Self Care | Payer: Medicaid Other | Source: Ambulatory Visit | Attending: Family Medicine | Admitting: Family Medicine

## 2020-10-17 DIAGNOSIS — M542 Cervicalgia: Secondary | ICD-10-CM | POA: Diagnosis not present

## 2020-10-17 DIAGNOSIS — M25559 Pain in unspecified hip: Secondary | ICD-10-CM

## 2020-10-17 DIAGNOSIS — M1611 Unilateral primary osteoarthritis, right hip: Secondary | ICD-10-CM | POA: Diagnosis not present

## 2020-11-04 ENCOUNTER — Ambulatory Visit (INDEPENDENT_AMBULATORY_CARE_PROVIDER_SITE_OTHER): Payer: Medicaid Other | Admitting: Family Medicine

## 2020-11-04 ENCOUNTER — Encounter: Payer: Self-pay | Admitting: Family Medicine

## 2020-11-04 ENCOUNTER — Other Ambulatory Visit: Payer: Self-pay

## 2020-11-04 VITALS — BP 142/80 | HR 99 | Ht 60.0 in | Wt 136.6 lb

## 2020-11-04 DIAGNOSIS — R202 Paresthesia of skin: Secondary | ICD-10-CM

## 2020-11-04 DIAGNOSIS — J301 Allergic rhinitis due to pollen: Secondary | ICD-10-CM

## 2020-11-04 LAB — POCT GLYCOSYLATED HEMOGLOBIN (HGB A1C): Hemoglobin A1C: 6.6 % — AB (ref 4.0–5.6)

## 2020-11-04 MED ORDER — FLUTICASONE PROPIONATE 50 MCG/ACT NA SUSP: 1.0000 | Freq: Every day | NASAL | 12 refills | Status: AC

## 2020-11-04 MED ORDER — OLOPATADINE HCL 0.1 % OP SOLN: 1.0000 [drp] | Freq: Two times a day (BID) | OPHTHALMIC | 12 refills | Status: DC

## 2020-11-04 NOTE — Patient Instructions (Signed)
It was nice to see you again today,  I am going to refer you to the hand surgeon.  They will call you within the week and you can schedule an appointment for any time including after you return from your trip abroad.  I want you to wear the splint every night.  It has to be a splint that is rigid to prevent your hand from moving during the night.  It would look similar to one of the pictures below.  I am getting some blood work to rule out other causes of the symptoms.  Have a great day,  Frederic Jericho, MD

## 2020-11-04 NOTE — Progress Notes (Signed)
    SUBJECTIVE:   CHIEF COMPLAINT / HPI:   Patient complaining of worsening tingling/numbness/weakness in her right hand on the first three digits.  The sensation stops at the wrist. She states her hand shakes if she is holding something.  Does not shake when resting. No tremor in left hand.  Has pain in her right shoulder and arm sometimes but it is an ache and not tingling or numbness like in her hand.  She wears the wrist brace occasionally but during the day only.  Not at night.  Patient states that her eyes have been watery and she has had some nasal congestion over the past few weeks.  Gets this every year.  Currently not taking any medication for it  PERTINENT  PMH / PSH:   OBJECTIVE:   BP (!) 142/80   Pulse 99   Ht 5' (1.524 m)   Wt 136 lb 9.6 oz (62 kg)   SpO2 98%   BMI 26.68 kg/m   Gen: alert. Oriented.  Accompanied by daughter. CV: Regular rate and rhythm Pulmonary: Lungs clear auscultation bilaterally   ASSESSMENT/PLAN:   Paresthesia of right upper extremity Still most likely due to carpal tunnel syndrome as the paresthesia only occurs in the hand in the upper extremity is more of an aching proximal to the hand.  Also was not wearing her splint properly.  Nor was it the correct splint for immobilization.  Discussed proper splint and technique for wearing it at night.  Will refer to hand surgeon for possible surgical treatment.  Also getting A1c and TSH.  Allergic rhinitis Rhinitis with watery eyes.  Send prescription for Flonase and Pataday.     Sandre Kitty, MD [facility]

## 2020-11-05 LAB — TSH: TSH: 1.11 u[IU]/mL (ref 0.450–4.500)

## 2020-11-06 NOTE — Assessment & Plan Note (Signed)
Rhinitis with watery eyes.  Send prescription for Flonase and Pataday.

## 2020-11-06 NOTE — Assessment & Plan Note (Addendum)
Still most likely due to carpal tunnel syndrome as the paresthesia only occurs in the hand in the upper extremity is more of an aching proximal to the hand.  Also was not wearing her splint properly.  Nor was it the correct splint for immobilization.  Discussed proper splint and technique for wearing it at night.  Will refer to hand surgeon for possible surgical treatment.  Also getting A1c and TSH.

## 2020-11-07 DIAGNOSIS — H25812 Combined forms of age-related cataract, left eye: Secondary | ICD-10-CM | POA: Diagnosis not present

## 2020-12-05 NOTE — Progress Notes (Signed)
    SUBJECTIVE:   CHIEF COMPLAINT / HPI:   Prediabetes: Patient has a family history of both type I and type 2 diabetes.  No personal history of diabetes.  Since hearing about her blood sugar results last time she has changed her diet to cut out carbs.  Carpal tunnel: Patient is wearing the new splint she got last visit.  Wears it at night.  Feels like it has helped some.  Has made an appointment with the hand surgeon for after she returns from her trip in June.  Ear pain: 1 month of right ear pain that comes and goes.  Bilateral ear canal itching.  Uses Q-tip to relieve the itch.  No hearing loss.  Occasional ringing in her ears but unsure which side.  No dizziness.  PERTINENT  PMH / PSH:   OBJECTIVE:   BP (!) 152/82   Pulse 82   Ht 5' (1.524 m)   Wt 132 lb 3.2 oz (60 kg)   SpO2 100%   BMI 25.82 kg/m   General: Alert and oriented.  No acute distress.  Accompanied by daughter. HEENT: Canals normal bilaterally.  Tympanic membranes normal bilaterally. CV: Regular rate and rhythm, no murmurs Pulmonary: Lungs clear to auscultation bilaterally  ASSESSMENT/PLAN:   Prediabetes We will get repeat A1c to confirm diabetes when she returns from her trip in June.  Discussed low carbohydrate diet with patient.  Carpal tunnel syndrome of right wrist Improved with the correct splint and using it nocturnally.  We will have visit with hand surgeon in June after her trip.  Eczema of both external ears Prescribed mometasone use in the ear daily, apply with Q-tip.  Use for 2 weeks.  Discontinue after that if not improved, use once a week after that if there was improvement.     Sandre Kitty, MD [facility]

## 2020-12-06 ENCOUNTER — Ambulatory Visit (INDEPENDENT_AMBULATORY_CARE_PROVIDER_SITE_OTHER): Payer: Medicaid Other | Admitting: Family Medicine

## 2020-12-06 ENCOUNTER — Other Ambulatory Visit: Payer: Self-pay

## 2020-12-06 ENCOUNTER — Encounter: Payer: Self-pay | Admitting: Family Medicine

## 2020-12-06 VITALS — BP 152/82 | HR 82 | Ht 60.0 in | Wt 132.2 lb

## 2020-12-06 DIAGNOSIS — Z23 Encounter for immunization: Secondary | ICD-10-CM | POA: Diagnosis not present

## 2020-12-06 DIAGNOSIS — H60543 Acute eczematoid otitis externa, bilateral: Secondary | ICD-10-CM | POA: Diagnosis not present

## 2020-12-06 DIAGNOSIS — G5601 Carpal tunnel syndrome, right upper limb: Secondary | ICD-10-CM

## 2020-12-06 DIAGNOSIS — R7303 Prediabetes: Secondary | ICD-10-CM | POA: Diagnosis not present

## 2020-12-06 MED ORDER — MOMETASONE FUROATE 0.1 % EX CREA: 1.0000 "application " | TOPICAL_CREAM | Freq: Every day | CUTANEOUS | 0 refills | Status: DC

## 2020-12-06 NOTE — Patient Instructions (Signed)
It was nice to see you again today,  For your itching in your ears I would like you to use this steroid cream I have prescribed.  Put a small amount on a Q-tip in rub it on the inside of the ear canal once a day.  Do this for 2 weeks.  If it does not help, stop taking it.  If after 2 weeks it is helping, you can switch to using it once a week to keep the itching from returning.  For your blood sugar levels, we will check you again when you come back after your visit in June.  Try to eat low carbohydrate foods if possible.  Try to avoid breads, starches, potatoes, sugars.  Have a great trip,  Frederic Jericho, MD

## 2020-12-08 DIAGNOSIS — H60543 Acute eczematoid otitis externa, bilateral: Secondary | ICD-10-CM | POA: Insufficient documentation

## 2020-12-08 DIAGNOSIS — E119 Type 2 diabetes mellitus without complications: Secondary | ICD-10-CM | POA: Insufficient documentation

## 2020-12-08 DIAGNOSIS — R7303 Prediabetes: Secondary | ICD-10-CM | POA: Insufficient documentation

## 2020-12-08 NOTE — Assessment & Plan Note (Signed)
We will get repeat A1c to confirm diabetes when she returns from her trip in June.  Discussed low carbohydrate diet with patient.

## 2020-12-08 NOTE — Assessment & Plan Note (Signed)
Prescribed mometasone use in the ear daily, apply with Q-tip.  Use for 2 weeks.  Discontinue after that if not improved, use once a week after that if there was improvement.

## 2020-12-08 NOTE — Assessment & Plan Note (Signed)
Improved with the correct splint and using it nocturnally.  We will have visit with hand surgeon in June after her trip.

## 2020-12-11 DIAGNOSIS — Z20822 Contact with and (suspected) exposure to covid-19: Secondary | ICD-10-CM | POA: Diagnosis not present

## 2021-04-08 ENCOUNTER — Ambulatory Visit (HOSPITAL_COMMUNITY)
Admission: EM | Admit: 2021-04-08 | Discharge: 2021-04-08 | Disposition: A | Discharge status: Home / Self Care | Payer: Medicaid Other | Attending: Student | Admitting: Student

## 2021-04-08 ENCOUNTER — Other Ambulatory Visit: Payer: Self-pay

## 2021-04-08 ENCOUNTER — Ambulatory Visit (INDEPENDENT_AMBULATORY_CARE_PROVIDER_SITE_OTHER): Payer: Medicaid Other

## 2021-04-08 ENCOUNTER — Encounter (HOSPITAL_COMMUNITY): Payer: Self-pay | Admitting: Emergency Medicine

## 2021-04-08 DIAGNOSIS — R0602 Shortness of breath: Secondary | ICD-10-CM | POA: Diagnosis not present

## 2021-04-08 DIAGNOSIS — Z1152 Encounter for screening for COVID-19: Secondary | ICD-10-CM | POA: Diagnosis not present

## 2021-04-08 DIAGNOSIS — Z8709 Personal history of other diseases of the respiratory system: Secondary | ICD-10-CM | POA: Diagnosis not present

## 2021-04-08 DIAGNOSIS — J209 Acute bronchitis, unspecified: Secondary | ICD-10-CM | POA: Diagnosis not present

## 2021-04-08 DIAGNOSIS — R519 Headache, unspecified: Secondary | ICD-10-CM | POA: Diagnosis not present

## 2021-04-08 DIAGNOSIS — R059 Cough, unspecified: Secondary | ICD-10-CM

## 2021-04-08 MED ORDER — PROMETHAZINE-DM 6.25-15 MG/5ML PO SYRP: 5.0000 mL | ORAL_SOLUTION | Freq: Four times a day (QID) | ORAL | 0 refills | Status: DC | PRN

## 2021-04-08 MED ORDER — BENZONATATE 100 MG PO CAPS: 100.0000 mg | ORAL_CAPSULE | Freq: Three times a day (TID) | ORAL | 0 refills | Status: DC

## 2021-04-08 MED ORDER — PREDNISONE 10 MG (21) PO TBPK: ORAL_TABLET | Freq: Every day | ORAL | 0 refills | Status: DC

## 2021-04-08 NOTE — ED Triage Notes (Signed)
Patient c/o productive cough w/ "yellow", sore throat, and headache x 1 week.   Patient endorses having fever w/ known temperature at home.   Patient endorses SOB while laying down.   Patient endorses [place] travel 2 [place] ago.   Patient has taken Tylenol, Dayquil, and Nyquil with no relief of symptoms.

## 2021-04-08 NOTE — ED Provider Notes (Signed)
[facility]    CSN: [id number] Arrival date & time: 04/08/21  1559      History   Chief Complaint Chief Complaint  Patient presents with   Cough   Sore Throat    HPI Stacy Mccormick is a 65 y.o. female presenting with 1 week of cough.  Medical history dyspnea on exertion, prediabetes, GERD, arthritis, hypertension.  Here today with daughter who helps provide history.  They note 1 week of cough productive of yellow sputum, shortness of breath with laying flat, subjective chills, lightheadedness with going from sitting to standing, fatigue, nasal congestion. Denies n/v/d, chest pain,  facial pain, teeth pain, headaches, sore throat, loss of taste/smell, swollen lymph nodes, ear pain, chest pain, pedal edema.  HPI  Past Medical History:  Diagnosis Date   Abnormal mammogram 12/09/2011   Right breast lump/mass on mammogram. Plan for f/u mammogram, R Br U/S and U/S guided core biopsy on 12/14/11 at the [facility].  12/16/11: No persistent worrisome abnormality upon additional imagine of the right breast. Yearly screening mammography suggested.     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      Home Medications    Prior to Admission medications   Medication Sig Start Date End Date Taking? Authorizing Provider  amLODipine (NORVASC) 5 MG tablet Take 1 tablet (5 mg total) by mouth daily. 10/10/20  Yes Sandre Kitty, MD  benzonatate (TESSALON) 100 MG capsule Take 1 capsule (100 mg total) by mouth every 8 (eight) hours. 04/08/21  Yes Rhys Martini, PA-C  predniSONE (STERAPRED UNI-PAK 21 TAB) 10 MG (21) TBPK tablet Take by mouth daily. Take 6 tabs by mouth daily  for 2 days, then 5 tabs for 2 days, then 4 tabs for 2 days, then 3 tabs for 2 days, 2 tabs for 2 days, then 1 tab by mouth daily for 2 days 04/08/21  Yes Rhys Martini, PA-C  promethazine-dextromethorphan (PROMETHAZINE-DM) 6.25-15 MG/5ML syrup Take 5 mLs by mouth 4 (four) times daily as needed for cough. 04/08/21  Yes Rhys Martini, PA-C  diclofenac Sodium (VOLTAREN) 1 % GEL Apply 2 g topically 4 (four) times daily. 10/10/20   Sandre Kitty, MD  Elastic Bandages & Supports (WRIST/THUMB SPLINT/LEFT SMALL) MISC 1 each by Does not apply route at bedtime. 06/19/19   Sandre Kitty, MD  Elastic Bandages & Supports (WRIST/THUMB SPLINT/RIGHT SMALL) MISC 1 each by Does not apply  route at bedtime. 06/19/19   Sandre Kitty, MD  fluticasone (FLONASE) 50 MCG/ACT nasal spray Place 1 spray into both nostrils daily. 1 spray in each nostril every day 11/04/20   Sandre Kitty, MD  hydrochlorothiazide (HYDRODIURIL) 25 MG tablet Take 1 tablet (25 mg total) by mouth daily. 10/10/20   Sandre Kitty, MD  hydrocortisone 1 % ointment Apply 1 application topically 2 (two) times daily. 04/14/18   Melene Plan, MD  KETOCONAZOLE, TOPICAL, 1 % SHAM Apply to affected area once a day, then wash off. 06/19/19   Sandre Kitty, MD   mometasone (ELOCON) 0.1 % cream Apply 1 application topically daily. For 2 weeks 12/06/20   Sandre Kitty, MD  olopatadine (PATADAY) 0.1 % ophthalmic solution Place 1 drop into both eyes 2 (two) times daily. 11/04/20   Sandre Kitty, MD    Family History Family History  Problem Relation Age of Onset   Diabetes Father    Diabetes Daughter    Kidney disease Daughter     Social History Social History   Tobacco Use   Smoking status: Never   Smokeless tobacco: Never  Substance Use Topics   Alcohol use: Never     Allergies   Patient has no known allergies.   Review of Systems Review of Systems  Constitutional:  Positive for chills. Negative for appetite change and fever.  HENT:  Positive for congestion. Negative for ear pain, rhinorrhea, sinus pressure, sinus pain and sore throat.   Eyes:  Negative for redness and visual disturbance.  Respiratory:  Positive for cough and shortness of breath. Negative for chest tightness and wheezing.   Cardiovascular:  Negative for chest pain and palpitations.  Gastrointestinal:  Negative for abdominal pain, constipation, diarrhea, nausea and vomiting.  Genitourinary:  Negative for dysuria, frequency and urgency.  Musculoskeletal:  Negative for myalgias.  Neurological:  Positive for light-headedness. Negative for dizziness, weakness and headaches.  Psychiatric/Behavioral:  Negative for confusion.   All other systems reviewed and are negative.   Physical Exam Triage Vital Signs ED Triage Vitals  Enc Vitals Group     BP 04/08/21 1707 132/78     Pulse Rate 04/08/21 1707 99     Resp 04/08/21 1707 17     Temp 04/08/21 1707 98.8 F (37.1 C)     Temp Source 04/08/21 1707 Oral     SpO2 04/08/21 1707 96 %     Weight --      Height --      Head Circumference --      Peak Flow --      Pain Score 04/08/21 1705 6     Pain Loc --      Pain Edu? --      Excl. in GC? --    No data found.  Updated Vital Signs BP 132/78 (BP Location:  Right Arm)   Pulse 99   Temp 98.8 F (37.1 C) (Oral)   Resp 17   SpO2 96%   Visual Acuity Right Eye Distance:   Left Eye Distance:   Bilateral Distance:    Right Eye Near:   Left Eye Near:    Bilateral Near:     Physical Exam Vitals reviewed.  Constitutional:      General: She is not in acute distress.    Appearance: Normal appearance. She is not ill-appearing or diaphoretic.  HENT:     Head: Normocephalic and atraumatic.     Right Ear: Tympanic membrane,  ear canal and external ear normal. No tenderness. No middle ear effusion. There is no impacted cerumen. Tympanic membrane is not perforated, erythematous, retracted or bulging.     Left Ear: Tympanic membrane, ear canal and external ear normal. No tenderness.  No middle ear effusion. There is no impacted cerumen. Tympanic membrane is not perforated, erythematous, retracted or bulging.     Nose: Nose normal. No congestion.     Mouth/Throat:     Mouth: Mucous membranes are moist.     Pharynx: Uvula midline. No oropharyngeal exudate or posterior oropharyngeal erythema.  Eyes:     Extraocular Movements: Extraocular movements intact.     Pupils: Pupils are equal, round, and reactive to light.  Cardiovascular:     Rate and Rhythm: Normal rate and regular rhythm.     Pulses:          Radial pulses are 2+ on the right side and 2+ on the left side.     Heart sounds: Normal heart sounds.  Pulmonary:     Effort: Pulmonary effort is normal. No tachypnea, bradypnea or accessory muscle usage.     Breath sounds: No stridor. Decreased breath sounds present. No wheezing, rhonchi or rales.     Comments: Decreased breath sounds L lung fields Abdominal:     Palpations: Abdomen is soft.     Tenderness: There is no abdominal tenderness. There is no guarding or rebound.  Musculoskeletal:     Right lower leg: No edema.     Left lower leg: No edema.  Skin:    General: Skin is warm.     Capillary Refill: Capillary refill takes less than 2  seconds.  Neurological:     General: No focal deficit present.     Mental Status: She is alert and oriented to person, place, and time.  Psychiatric:        Mood and Affect: Mood normal.        Behavior: Behavior normal.        Thought Content: Thought content normal.        Judgment: Judgment normal.     UC Treatments / Results  Labs (all labs ordered are listed, but only abnormal results are displayed) Labs Reviewed  SARS CORONAVIRUS 2 (TAT 6-24 HRS)    EKG   Radiology DG Chest 2 View  Result Date: 04/08/2021 CLINICAL DATA:  Short of breath, cough, headache for 1 week EXAM: CHEST - 2 VIEW COMPARISON:  07/08/2009 FINDINGS: Frontal and lateral views of the chest demonstrate an unremarkable cardiac silhouette. No airspace disease, effusion, or pneumothorax. No acute bony abnormalities. IMPRESSION: 1. No acute intrathoracic process. Electronically Signed   By: Sharlet Salina M.D.   On: 04/08/2021 17:49    Procedures Procedures (including critical care time)  Medications Ordered in UC Medications - No data to display  Initial Impression / Assessment and Plan / UC Course  I have reviewed the triage vital signs and the nursing notes.  Pertinent labs & imaging results that were available during my care of the patient were reviewed by me and considered in my medical decision making (see chart for details).     This patient is a very pleasant 65 y.o. year old female presenting with viral bronchitis. Afebrile, nontachy. 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  CXR- no acute cardiopulmonary disease. EKG- NSR. Covid PCR sent.  Prednisone taper, promethazine, tessalon.  ED return precautions discussed. Patient and daughter verbalizes understanding and agreement.  Translation services used and daughter also helped provide translation today.  Coding this visit a Level 4 for acute illness with systemic symptoms and prescription drug  management.  Final Clinical Impressions(s) / UC Diagnoses   Final diagnoses:  Acute bronchitis, unspecified organism  Encounter for screening for COVID-19  History of asthma     Discharge Instructions      -Prednisone taper for cough/bronchitis. I recommend taking this in the morning as it could give you energy.  Avoid NSAIDs like ibuprofen and alleve while taking this medication as they can increase your risk of stomach upset and even GI bleeding when in combination with a steroid. You can continue tylenol (acetaminophen) up to 1000mg  3x daily.  -Promethazine DM cough syrup for congestion/cough. This could make you drowsy, so take at night before bed. -Tessalon (Benzonatate) as needed for cough. Take one pill up to 3x daily (every 8 hours) -Tylenol for fevers/chills, bodyaches, headaches -Follow-up with PCP if symptoms worsen/persist (worsening shortness of breath, dizziness, chest pain, fever/chills.) -With a virus, you're typically contagious for 5-7 days, or as long as you're having fevers.       ED Prescriptions     Medication Sig Dispense Auth. Provider   predniSONE (STERAPRED UNI-PAK 21 TAB) 10 MG (21) TBPK tablet Take by mouth daily. Take 6 tabs by mouth daily  for 2 days, then 5 tabs for 2 days, then 4 tabs for 2 days, then 3 tabs for 2 days, 2 tabs for 2 days, then 1 tab by mouth daily for 2 days 42 tablet Rhys MartiniGraham, Lachelle Rissler E, PA-C   promethazine-dextromethorphan (PROMETHAZINE-DM) 6.25-15 MG/5ML syrup Take 5 mLs by mouth 4 (four) times daily as needed for cough. 118 mL Rhys MartiniGraham, Miguelina Fore E, PA-C   benzonatate (TESSALON) 100 MG capsule Take 1 capsule (100 mg total) by mouth every 8 (eight) hours. 21 capsule Rhys MartiniGraham, Shaterica Mcclatchy E, PA-C      PDMP not reviewed this encounter.   Rhys MartiniGraham, Murl Zogg E, PA-C 04/08/21 1808

## 2021-04-08 NOTE — Discharge Instructions (Addendum)
-  Prednisone taper for cough/bronchitis. I recommend taking this in the morning as it could give you energy.  Avoid NSAIDs like ibuprofen and alleve while taking this medication as they can increase your risk of stomach upset and even GI bleeding when in combination with a steroid. You can continue tylenol (acetaminophen) up to 1000mg  3x daily.  -Promethazine DM cough syrup for congestion/cough. This could make you drowsy, so take at night before bed. -Tessalon (Benzonatate) as needed for cough. Take one pill up to 3x daily (every 8 hours) -Tylenol for fevers/chills, bodyaches, headaches -Follow-up with PCP if symptoms worsen/persist (worsening shortness of breath, dizziness, chest pain, fever/chills.) -With a virus, you're typically contagious for 5-7 days, or as long as you're having fevers.

## 2021-04-09 ENCOUNTER — Ambulatory Visit: Payer: Medicaid Other

## 2021-04-09 LAB — SARS CORONAVIRUS 2 (TAT 6-24 HRS): SARS Coronavirus 2: NEGATIVE

## 2021-05-09 DIAGNOSIS — H25812 Combined forms of age-related cataract, left eye: Secondary | ICD-10-CM | POA: Diagnosis not present

## 2021-05-09 DIAGNOSIS — H2512 Age-related nuclear cataract, left eye: Secondary | ICD-10-CM | POA: Diagnosis not present

## 2021-05-09 DIAGNOSIS — Z01818 Encounter for other preprocedural examination: Secondary | ICD-10-CM | POA: Diagnosis not present

## 2021-07-02 DIAGNOSIS — R200 Anesthesia of skin: Secondary | ICD-10-CM | POA: Diagnosis not present

## 2021-07-02 DIAGNOSIS — G5603 Carpal tunnel syndrome, bilateral upper limbs: Secondary | ICD-10-CM | POA: Diagnosis not present

## 2021-07-02 DIAGNOSIS — R202 Paresthesia of skin: Secondary | ICD-10-CM | POA: Diagnosis not present

## 2021-07-30 DIAGNOSIS — M1811 Unilateral primary osteoarthritis of first carpometacarpal joint, right hand: Secondary | ICD-10-CM | POA: Diagnosis not present

## 2021-07-30 DIAGNOSIS — M79642 Pain in left hand: Secondary | ICD-10-CM | POA: Diagnosis not present

## 2021-07-30 DIAGNOSIS — R200 Anesthesia of skin: Secondary | ICD-10-CM | POA: Diagnosis not present

## 2021-07-30 DIAGNOSIS — M778 Other enthesopathies, not elsewhere classified: Secondary | ICD-10-CM | POA: Diagnosis not present

## 2021-07-30 DIAGNOSIS — M79641 Pain in right hand: Secondary | ICD-10-CM | POA: Diagnosis not present

## 2021-07-31 DIAGNOSIS — L299 Pruritus, unspecified: Secondary | ICD-10-CM | POA: Diagnosis not present

## 2021-08-04 ENCOUNTER — Other Ambulatory Visit: Payer: Self-pay | Admitting: Family Medicine

## 2022-01-02 ENCOUNTER — Other Ambulatory Visit: Payer: Self-pay | Admitting: Family Medicine

## 2022-02-04 ENCOUNTER — Other Ambulatory Visit: Payer: Self-pay | Admitting: Family Medicine

## 2022-02-09 NOTE — Progress Notes (Signed)
    SUBJECTIVE:   CHIEF COMPLAINT / HPI:  Chief Complaint  Patient presents with   Annual Exam    Here with daughter who is translating.  Reporting SOB at night when laying flat. Also has chronic cough. Denies chest pain.  Orthopnea and cough have been ongoing for several years.  She had an echocardiogram done in 2019 which was overall unremarkable with normal EF and normal diastolic parameters.  Patient states that she has not had any worsening of her symptoms over the past few years.  She uses 1-2 pillows to sleep at night.  Home readings does not check often but is typically [id number] systolic.  PERTINENT  PMH / PSH: HTN, GERD, prediabetes  Patient Care Team: Zola Button, MD as PCP - General (Family Medicine)   OBJECTIVE:   BP (!) 161/81   Pulse 68   Ht 5' (1.524 m)   Wt 134 lb (60.8 kg)   SpO2 96%   BMI 26.17 kg/m   Physical Exam Constitutional:      General: She is not in acute distress.    Appearance: Normal appearance.  HENT:     Head: Normocephalic and atraumatic.  Cardiovascular:     Rate and Rhythm: Normal rate and regular rhythm.     Heart sounds: Normal heart sounds. No murmur heard. Pulmonary:     Effort: Pulmonary effort is normal. No respiratory distress.     Breath sounds: Normal breath sounds. No rales.  Musculoskeletal:     Cervical back: Neck supple.     Right lower leg: No edema.     Left lower leg: No edema.  Neurological:     Mental Status: She is alert.        02/11/2022    4:36 PM  Depression screen PHQ 2/9  Decreased Interest 0  Down, Depressed, Hopeless 0  PHQ - 2 Score 0  Altered sleeping 0  Tired, decreased energy 0  Change in appetite 0  Feeling bad or failure about yourself  0  Trouble concentrating 0  Moving slowly or fidgety/restless 0  Suicidal thoughts 0  PHQ-9 Score 0     {Show previous vital signs (optional):23777}    ASSESSMENT/PLAN:   Encounter for annual physical exam of adult  Hypertriglyceridemia Not on  statin, check lipid panel.  May consider starting statin pending ASCVD risk.  Prediabetes Check A1c  Essential hypertension Elevated reading in office today.  Normal ambulatory readings though when checking occasionally.  Advised to continue monitoring and follow-up in 1 month.  Check BMP.  DOE (dyspnea on exertion) Previously noted dyspnea on exertion after walking extended periods of time and mild orthopnea ongoing for several years.  Previously normal TTE in 2019.  No change in symptoms, exam unremarkable with no signs of hypervolemia. - check CBC - advised to return if worsening, would consider repeating TTE  HCM - mammogram ordered - Cologuard ordered - shingles vaccine to be discussed at follow-up - Covid vaccine bivalent booster given - pneumonia vaccine PCV20 given - DXA scan to be discussed at follow-up  Future lab orders were placed as unable to obtain today due to lab closure  Return in about 4 weeks (around 03/11/2022) for f/u HTN.   Zola Button, MD [facility]

## 2022-02-09 NOTE — Patient Instructions (Incomplete)
It was nice seeing you today!  Try the cetirizine and allergy eye drops.  Follow-up in 6 months if everything looks good.  Stay well, Stacy Deeds, MD [facility] [phone]  --  Make sure to check out at the front desk before you leave today.  Please arrive at least 15 minutes prior to your scheduled appointments.  If you had blood work today, I will send you a MyChart message or a letter if results are normal. Otherwise, I will give you a call.  If you had a referral placed, they will call you to set up an appointment. Please give [place] a call if you don't hear back in the next 2 weeks.  If you need additional refills before your next appointment, please call your pharmacy first.

## 2022-02-11 ENCOUNTER — Ambulatory Visit (INDEPENDENT_AMBULATORY_CARE_PROVIDER_SITE_OTHER): Payer: Medicaid Other | Admitting: Family Medicine

## 2022-02-11 ENCOUNTER — Encounter: Payer: Self-pay | Admitting: Family Medicine

## 2022-02-11 VITALS — BP 161/81 | HR 68 | Ht 60.0 in | Wt 134.0 lb

## 2022-02-11 DIAGNOSIS — Z0000 Encounter for general adult medical examination without abnormal findings: Secondary | ICD-10-CM | POA: Diagnosis not present

## 2022-02-11 DIAGNOSIS — Z23 Encounter for immunization: Secondary | ICD-10-CM

## 2022-02-11 DIAGNOSIS — R0601 Orthopnea: Secondary | ICD-10-CM

## 2022-02-11 DIAGNOSIS — I10 Essential (primary) hypertension: Secondary | ICD-10-CM

## 2022-02-11 DIAGNOSIS — R0609 Other forms of dyspnea: Secondary | ICD-10-CM | POA: Diagnosis not present

## 2022-02-11 DIAGNOSIS — Z1211 Encounter for screening for malignant neoplasm of colon: Secondary | ICD-10-CM

## 2022-02-11 DIAGNOSIS — Z1231 Encounter for screening mammogram for malignant neoplasm of breast: Secondary | ICD-10-CM | POA: Diagnosis not present

## 2022-02-11 DIAGNOSIS — R7303 Prediabetes: Secondary | ICD-10-CM | POA: Diagnosis not present

## 2022-02-11 DIAGNOSIS — E781 Pure hyperglyceridemia: Secondary | ICD-10-CM | POA: Diagnosis not present

## 2022-02-11 MED ORDER — CETIRIZINE HCL 10 MG PO TABS: 10.0000 mg | ORAL_TABLET | Freq: Every day | ORAL | 11 refills | Status: AC

## 2022-02-11 MED ORDER — HYDROCHLOROTHIAZIDE 25 MG PO TABS: 25.0000 mg | ORAL_TABLET | Freq: Every day | ORAL | 1 refills | Status: DC

## 2022-02-11 MED ORDER — AMLODIPINE BESYLATE 5 MG PO TABS: 5.0000 mg | ORAL_TABLET | Freq: Every day | ORAL | 1 refills | Status: DC

## 2022-02-11 MED ORDER — OLOPATADINE HCL 0.1 % OP SOLN: 1.0000 [drp] | Freq: Two times a day (BID) | OPHTHALMIC | 12 refills | Status: DC | PRN

## 2022-02-12 ENCOUNTER — Encounter: Payer: Self-pay | Admitting: Family Medicine

## 2022-02-12 ENCOUNTER — Ambulatory Visit (INDEPENDENT_AMBULATORY_CARE_PROVIDER_SITE_OTHER): Payer: Medicaid Other

## 2022-02-12 DIAGNOSIS — Z23 Encounter for immunization: Secondary | ICD-10-CM

## 2022-02-12 NOTE — Assessment & Plan Note (Signed)
Check A1c. 

## 2022-02-12 NOTE — Assessment & Plan Note (Signed)
Previously noted dyspnea on exertion after walking extended periods of time and mild orthopnea ongoing for several years.  Previously normal TTE in 2019.  No change in symptoms, exam unremarkable with no signs of hypervolemia. - check CBC - advised to return if worsening, would consider repeating TTE

## 2022-02-12 NOTE — Assessment & Plan Note (Signed)
Not on statin, check lipid panel.  May consider starting statin pending ASCVD risk.

## 2022-02-12 NOTE — Assessment & Plan Note (Addendum)
Elevated reading in office today.  Normal ambulatory readings though when checking occasionally.  Advised to continue monitoring and follow-up in 1 month.  Check BMP.

## 2022-02-13 ENCOUNTER — Other Ambulatory Visit: Payer: Medicaid Other

## 2022-02-13 DIAGNOSIS — R7303 Prediabetes: Secondary | ICD-10-CM | POA: Diagnosis not present

## 2022-02-13 DIAGNOSIS — E781 Pure hyperglyceridemia: Secondary | ICD-10-CM | POA: Diagnosis not present

## 2022-02-13 DIAGNOSIS — R0601 Orthopnea: Secondary | ICD-10-CM | POA: Diagnosis not present

## 2022-02-13 DIAGNOSIS — I10 Essential (primary) hypertension: Secondary | ICD-10-CM

## 2022-02-14 LAB — BASIC METABOLIC PANEL
BUN/Creatinine Ratio: 15 (ref 12–28)
BUN: 11 mg/dL (ref 8–27)
CO2: 22 mmol/L (ref 20–29)
Calcium: 10.1 mg/dL (ref 8.7–10.3)
Chloride: 101 mmol/L (ref 96–106)
Creatinine, Ser: 0.71 mg/dL (ref 0.57–1.00)
Glucose: 101 mg/dL — ABNORMAL HIGH (ref 70–99)
Potassium: 3.6 mmol/L (ref 3.5–5.2)
Sodium: 142 mmol/L (ref 134–144)
eGFR: 93 mL/min/{1.73_m2} (ref >59)

## 2022-02-14 LAB — HEMOGLOBIN A1C
Est. average glucose Bld gHb Est-mCnc: 143 mg/dL
Hgb A1c MFr Bld: 6.6 % — ABNORMAL HIGH (ref 4.8–5.6)

## 2022-02-14 LAB — CBC
Hematocrit: 42.2 % (ref 34.0–46.6)
Hemoglobin: 14.1 g/dL (ref 11.1–15.9)
MCH: 28.5 pg (ref 26.6–33.0)
MCHC: 33.4 g/dL (ref 31.5–35.7)
MCV: 85 fL (ref 79–97)
Platelets: 214 10*3/uL (ref 150–450)
RBC: 4.94 x10E6/uL (ref 3.77–5.28)
RDW: 13.1 % (ref 11.7–15.4)
WBC: 7.2 10*3/uL (ref 3.4–10.8)

## 2022-02-14 LAB — LIPID PANEL
Chol/HDL Ratio: 4.1 ratio (ref 0.0–4.4)
Cholesterol, Total: 206 mg/dL — ABNORMAL HIGH (ref 100–199)
HDL: 50 mg/dL (ref >39)
LDL Chol Calc (NIH): 127 mg/dL — ABNORMAL HIGH (ref 0–99)
Triglycerides: 163 mg/dL — ABNORMAL HIGH (ref 0–149)
VLDL Cholesterol Cal: 29 mg/dL (ref 5–40)

## 2022-02-17 ENCOUNTER — Encounter: Payer: Self-pay | Admitting: Family Medicine

## 2022-03-16 ENCOUNTER — Ambulatory Visit: Payer: Medicaid Other | Admitting: Family Medicine

## 2022-03-26 ENCOUNTER — Ambulatory Visit: Payer: Medicaid Other

## 2022-03-26 NOTE — Progress Notes (Signed)
SUBJECTIVE:   CHIEF COMPLAINT / HPI:  Chief Complaint  Patient presents with   Hypertension   decreased leg strength in right leg   Headache    Having headaches on right side of head x 3 days per week.  Makes her eye tear up    Here with daughter who is translating.  HTN Takes amlodipine 5 mg and HCTZ 25 mg. Does not have a BP cuff yet.  Patient has been having intermittent right-sided headaches occurring about 3 times weekly for the past several months.  She takes Tylenol on occasion which does relieve the headache.  Headache never last more than a day.  Patient believes headaches are related to cataract surgery she had on the right eye about 1 year ago.  She does feel tightness in her neck muscles.  Denies nausea, vomiting, photophobia, phonophobia.   Patient also reports weakness in the right leg ongoing for the past few weeks.  She almost had a fall as a result of the weakness.  She has had ongoing hip pain for years.  Discussed lipid panel results from previous visit.  Patient does not want to start a medication for cholesterol at this time, wants to work on dietary changes.  PERTINENT  PMH / PSH: HTN, GERD, prediabetes  Patient Care Team: Littie Deeds, MD as PCP - General (Family Medicine)   OBJECTIVE:   BP (!) 160/90   Pulse 76   Ht 5' (1.524 m)   Wt 131 lb (59.4 kg)   SpO2 98%   BMI 25.58 kg/m   Physical Exam Constitutional:      General: She is not in acute distress. Cardiovascular:     Rate and Rhythm: Normal rate and regular rhythm.  Pulmonary:     Effort: Pulmonary effort is normal. No respiratory distress.     Breath sounds: Normal breath sounds.  Musculoskeletal:     Cervical back: Neck supple.     Comments: Mild discomfort with internal and external rotation of the right hip.  Neurological:     Mental Status: She is alert.     Cranial Nerves: No cranial nerve deficit.     Comments: 4/5 strength with hip flexion on the right, flexion at the elbow  on the right, otherwise 5/5 in bilateral upper and lower extremities.         03/27/2022    8:50 AM  Depression screen PHQ 2/9  Decreased Interest 0  Down, Depressed, Hopeless 0  PHQ - 2 Score 0  Altered sleeping 0  Tired, decreased energy 0  Change in appetite 0  Feeling bad or failure about yourself  0  Trouble concentrating 0  Moving slowly or fidgety/restless 0  Suicidal thoughts 0  PHQ-9 Score 0     {Show previous vital signs (optional):23777}    ASSESSMENT/PLAN:   Hyperlipidemia ASCVD risk 16.9%.  Patient declined statin at this time, opted for lifestyle changes.  We will continue discussion, could consider coronary artery calcium scoring at follow-up.   Tension headache Right-sided headaches occurring few times weekly more consistent with tension headache especially with reported neck muscle tightness.  Low suspicion for CVA or intracranial hemorrhage given time course, neuro exam unremarkable and suspect right-sided lower extremity weakness is related to chronic right hip pain, possibly OA. - continue acetaminophen prn - return precautions provided  Chronic right hip pain Suspect underlying OA.  Previous imaging revealed mild degenerative change, patient to repeat imaging. - XR right hip - PT referral  Essential hypertension Uncontrolled in office. - increase amlodipine to 10 mg - continue HCTZ 25 mg - advised to obtain BP cuff    Return in about 4 weeks (around 04/24/2022) for f/u HTN.   Littie Deeds, MD [facility]

## 2022-03-27 ENCOUNTER — Encounter: Payer: Self-pay | Admitting: Family Medicine

## 2022-03-27 ENCOUNTER — Ambulatory Visit (INDEPENDENT_AMBULATORY_CARE_PROVIDER_SITE_OTHER): Payer: Medicaid Other | Admitting: Family Medicine

## 2022-03-27 VITALS — BP 160/90 | HR 76 | Ht 60.0 in | Wt 131.0 lb

## 2022-03-27 DIAGNOSIS — I10 Essential (primary) hypertension: Secondary | ICD-10-CM | POA: Diagnosis present

## 2022-03-27 DIAGNOSIS — E782 Mixed hyperlipidemia: Secondary | ICD-10-CM

## 2022-03-27 DIAGNOSIS — G44209 Tension-type headache, unspecified, not intractable: Secondary | ICD-10-CM

## 2022-03-27 DIAGNOSIS — M25551 Pain in right hip: Secondary | ICD-10-CM

## 2022-03-27 DIAGNOSIS — G8929 Other chronic pain: Secondary | ICD-10-CM | POA: Diagnosis not present

## 2022-03-27 MED ORDER — AMLODIPINE BESYLATE 10 MG PO TABS: 10.0000 mg | ORAL_TABLET | Freq: Every day | ORAL | 2 refills | Status: DC

## 2022-03-27 NOTE — Assessment & Plan Note (Signed)
Uncontrolled in office. - increase amlodipine to 10 mg - continue HCTZ 25 mg - advised to obtain BP cuff

## 2022-03-27 NOTE — Assessment & Plan Note (Signed)
Right-sided headaches occurring few times weekly more consistent with tension headache especially with reported neck muscle tightness.  Low suspicion for CVA or intracranial hemorrhage given time course, neuro exam unremarkable and suspect right-sided lower extremity weakness is related to chronic right hip pain, possibly OA. - continue acetaminophen prn - return precautions provided

## 2022-03-27 NOTE — Assessment & Plan Note (Signed)
ASCVD risk 16.9%.  Patient declined statin at this time, opted for lifestyle changes.  We will continue discussion, could consider coronary artery calcium scoring at follow-up.

## 2022-03-27 NOTE — Assessment & Plan Note (Signed)
Suspect underlying OA.  Previous imaging revealed mild degenerative change, patient to repeat imaging. - XR right hip - PT referral

## 2022-03-27 NOTE — Patient Instructions (Addendum)
It was nice seeing you today!  Work on getting a blood pressure cuff and check the blood pressure a few times a week and write them down.  Increase amlodipine to 10 mg.  [facility] Address: [address], [place], [place] [address] Phone: [phone]   If you are having a severe headache or noticing worsening leg weakness, please give [place] a call or go to the emergency room.  Stay well, Littie Deeds, MD [facility] [phone]  --  Make sure to check out at the front desk before you leave today.  Please arrive at least 15 minutes prior to your scheduled appointments.  If you had blood work today, I will send you a MyChart message or a letter if results are normal. Otherwise, I will give you a call.  If you had a referral placed, they will call you to set up an appointment. Please give [place] a call if you don't hear back in the next 2 weeks.  If you need additional refills before your next appointment, please call your pharmacy first.

## 2022-04-06 ENCOUNTER — Ambulatory Visit: Payer: Medicaid Other

## 2022-04-13 ENCOUNTER — Ambulatory Visit
Admission: RE | Admit: 2022-04-13 | Discharge: 2022-04-13 | Disposition: A | Discharge status: Home / Self Care | Payer: Medicaid Other | Source: Ambulatory Visit | Attending: Family Medicine | Admitting: Family Medicine

## 2022-04-13 DIAGNOSIS — Z1231 Encounter for screening mammogram for malignant neoplasm of breast: Secondary | ICD-10-CM

## 2022-04-13 DIAGNOSIS — I10 Essential (primary) hypertension: Secondary | ICD-10-CM

## 2022-04-16 ENCOUNTER — Other Ambulatory Visit: Payer: Self-pay | Admitting: Family Medicine

## 2022-04-16 DIAGNOSIS — R928 Other abnormal and inconclusive findings on diagnostic imaging of breast: Secondary | ICD-10-CM

## 2022-04-22 ENCOUNTER — Other Ambulatory Visit: Payer: Medicaid Other

## 2022-04-24 ENCOUNTER — Other Ambulatory Visit: Payer: Self-pay | Admitting: Family Medicine

## 2022-05-11 ENCOUNTER — Ambulatory Visit
Admission: RE | Admit: 2022-05-11 | Discharge: 2022-05-11 | Disposition: A | Discharge status: Home / Self Care | Payer: Medicaid Other | Source: Ambulatory Visit | Attending: Family Medicine | Admitting: Family Medicine

## 2022-05-11 DIAGNOSIS — N6002 Solitary cyst of left breast: Secondary | ICD-10-CM | POA: Diagnosis not present

## 2022-05-11 DIAGNOSIS — R928 Other abnormal and inconclusive findings on diagnostic imaging of breast: Secondary | ICD-10-CM

## 2022-05-12 ENCOUNTER — Ambulatory Visit: Payer: Medicaid Other | Admitting: Family Medicine

## 2022-05-28 DIAGNOSIS — L299 Pruritus, unspecified: Secondary | ICD-10-CM | POA: Diagnosis not present

## 2022-06-05 DIAGNOSIS — L810 Postinflammatory hyperpigmentation: Secondary | ICD-10-CM | POA: Diagnosis not present

## 2022-06-05 DIAGNOSIS — L299 Pruritus, unspecified: Secondary | ICD-10-CM | POA: Diagnosis not present

## 2022-06-11 ENCOUNTER — Ambulatory Visit: Payer: Medicaid Other | Admitting: Family Medicine

## 2022-06-16 ENCOUNTER — Encounter: Payer: Self-pay | Admitting: Family Medicine

## 2022-06-16 ENCOUNTER — Ambulatory Visit (INDEPENDENT_AMBULATORY_CARE_PROVIDER_SITE_OTHER): Payer: Medicaid Other | Admitting: Family Medicine

## 2022-06-16 VITALS — BP 128/62 | HR 97 | Ht 60.0 in | Wt 134.6 lb

## 2022-06-16 DIAGNOSIS — E119 Type 2 diabetes mellitus without complications: Secondary | ICD-10-CM | POA: Diagnosis not present

## 2022-06-16 DIAGNOSIS — R5383 Other fatigue: Secondary | ICD-10-CM | POA: Diagnosis not present

## 2022-06-16 DIAGNOSIS — G8929 Other chronic pain: Secondary | ICD-10-CM

## 2022-06-16 DIAGNOSIS — M25551 Pain in right hip: Secondary | ICD-10-CM

## 2022-06-16 DIAGNOSIS — I10 Essential (primary) hypertension: Secondary | ICD-10-CM | POA: Diagnosis present

## 2022-06-16 DIAGNOSIS — Z9189 Other specified personal risk factors, not elsewhere classified: Secondary | ICD-10-CM

## 2022-06-16 DIAGNOSIS — E782 Mixed hyperlipidemia: Secondary | ICD-10-CM

## 2022-06-16 DIAGNOSIS — Z23 Encounter for immunization: Secondary | ICD-10-CM

## 2022-06-16 MED ORDER — HYDROCHLOROTHIAZIDE 25 MG PO TABS: 25.0000 mg | ORAL_TABLET | Freq: Every day | ORAL | 1 refills | Status: DC

## 2022-06-16 MED ORDER — AMLODIPINE BESYLATE 10 MG PO TABS: 10.0000 mg | ORAL_TABLET | Freq: Every day | ORAL | 1 refills | Status: DC

## 2022-06-16 NOTE — Assessment & Plan Note (Signed)
Reports symptoms are improved. Previously referred to PT but patient never went.

## 2022-06-16 NOTE — Assessment & Plan Note (Signed)
Controlled, improved on repeat. - advised to monitor BP at home - continue amlodipine 10 mg, HCTZ 25 mg

## 2022-06-16 NOTE — Progress Notes (Signed)
    SUBJECTIVE:   CHIEF COMPLAINT / HPI:  Chief Complaint  Patient presents with   Hypertension    Here with daughter who is translating.  HTN On HCTZ 25 mg, amlodipine 10 mg (increased at last visit). Still has not gotten a BP cuff but will plan on obtaining one.  Previously referred to PT for chronic right hip pain. Hip XR ordered, was never done. Never did PT. Reports hip pain is better.  Also declined statin previously. Still does not want a statin. Interested in calcium scoring test.  Reports having some fatigue. Sleeping normally, about 8 hours/night. Walking 20-30 minutes every day. Chronic orthopnea which is unchanged. Not interfering with her day to day activities.  PERTINENT  PMH / PSH: HTN, GERD, prediabetes  Patient Care Team: Zola Button, MD as PCP - General (Family Medicine)   OBJECTIVE:   BP 128/62   Pulse 97   Ht 5' (1.524 m)   Wt 134 lb 9.6 oz (61.1 kg)   SpO2 96%   BMI 26.29 kg/m   Physical Exam Constitutional:      General: She is not in acute distress. Cardiovascular:     Rate and Rhythm: Normal rate and regular rhythm.     Heart sounds: Murmur heard.     Comments: 3/6 systolic murmur Pulmonary:     Effort: Pulmonary effort is normal. No respiratory distress.     Breath sounds: Normal breath sounds.  Musculoskeletal:     Cervical back: Neck supple.  Neurological:     Mental Status: She is alert.         06/16/2022    3:59 PM  Depression screen PHQ 2/9  Decreased Interest 0  Down, Depressed, Hopeless 0  PHQ - 2 Score 0  Altered sleeping 0  Tired, decreased energy 0  Change in appetite 0  Feeling bad or failure about yourself  0  Trouble concentrating 0  Moving slowly or fidgety/restless 0  Suicidal thoughts 0  PHQ-9 Score 0     Wt Readings from Last 3 Encounters:  06/16/22 134 lb 9.6 oz (61.1 kg)  03/27/22 131 lb (59.4 kg)  02/11/22 134 lb (60.8 kg)        ASSESSMENT/PLAN:   Fatigue Symptoms appear to be minimally  bothersome. - check labs: CBC, CMP, vitamin D  Chronic right hip pain Reports symptoms are improved. Previously referred to PT but patient never went.  Type II diabetes mellitus ([place]) Diet-controlled. - check A1c - advised regarding yearly DM eye exams, they have appt next month  Essential hypertension Controlled, improved on repeat. - advised to monitor BP at home - continue amlodipine 10 mg, HCTZ 25 mg  Hyperlipidemia ASCVD risk 23.5%. Counseled again advising statin use but patient declined. Amenable to starting medication if coronary calcium score is abnormal. - CT coronary calcium score - mediterranean diet info given    Return in about 3 months (around 09/15/2022) for f/u HTN.   Zola Button, MD [place]

## 2022-06-16 NOTE — Assessment & Plan Note (Signed)
ASCVD risk 23.5%. Counseled again advising statin use but patient declined. Amenable to starting medication if coronary calcium score is abnormal. - CT coronary calcium score - mediterranean diet info given

## 2022-06-16 NOTE — Assessment & Plan Note (Signed)
Diet-controlled. - check A1c - advised regarding yearly DM eye exams, they have appt next month

## 2022-06-16 NOTE — Patient Instructions (Addendum)
It was nice seeing you today!  Come back for your blood work.  We will call you regarding the calcium score test.  Try to check your blood pressure a few times a week and write the numbers down.  Make sure to have your eyes checked for retinopathy when you see the eye doctor.  Stay well, Stacy Button, MD [place] [phone]  --  Make sure to check out at the front desk before you leave today.  Please arrive at least 15 minutes prior to your scheduled appointments.  If you had blood work today, I will send you a MyChart message or a letter if results are normal. Otherwise, I will give you a call.  If you had a referral placed, they will call you to set up an appointment. Please give [place] a call if you don't hear back in the next 2 weeks.  If you need additional refills before your next appointment, please call your pharmacy first.

## 2022-06-18 ENCOUNTER — Other Ambulatory Visit: Payer: Medicaid Other

## 2022-06-19 ENCOUNTER — Other Ambulatory Visit: Payer: Medicaid Other

## 2022-06-19 DIAGNOSIS — R5383 Other fatigue: Secondary | ICD-10-CM

## 2022-06-19 DIAGNOSIS — E119 Type 2 diabetes mellitus without complications: Secondary | ICD-10-CM | POA: Diagnosis not present

## 2022-06-19 DIAGNOSIS — I10 Essential (primary) hypertension: Secondary | ICD-10-CM

## 2022-06-19 NOTE — Addendum Note (Signed)
Addended by: Zola Button D on: 06/19/2022 08:56 AM   Modules accepted: Orders

## 2022-06-20 LAB — COMPREHENSIVE METABOLIC PANEL
ALT: 21 IU/L (ref 0–32)
AST: 22 IU/L (ref 0–40)
Albumin/Globulin Ratio: 1.4 (ref 1.2–2.2)
Albumin: 4.9 g/dL (ref 3.9–4.9)
Alkaline Phosphatase: 52 IU/L (ref 44–121)
BUN/Creatinine Ratio: 11 — ABNORMAL LOW (ref 12–28)
BUN: 9 mg/dL (ref 8–27)
Bilirubin Total: 0.3 mg/dL (ref 0.0–1.2)
CO2: 23 mmol/L (ref 20–29)
Calcium: 10.2 mg/dL (ref 8.7–10.3)
Chloride: 100 mmol/L (ref 96–106)
Creatinine, Ser: 0.83 mg/dL (ref 0.57–1.00)
Globulin, Total: 3.5 g/dL (ref 1.5–4.5)
Glucose: 105 mg/dL — ABNORMAL HIGH (ref 70–99)
Potassium: 3.9 mmol/L (ref 3.5–5.2)
Sodium: 141 mmol/L (ref 134–144)
Total Protein: 8.4 g/dL (ref 6.0–8.5)
eGFR: 77 mL/min/{1.73_m2} (ref >59)

## 2022-06-20 LAB — CBC
Hematocrit: 41 % (ref 34.0–46.6)
Hemoglobin: 13.7 g/dL (ref 11.1–15.9)
MCH: 28.1 pg (ref 26.6–33.0)
MCHC: 33.4 g/dL (ref 31.5–35.7)
MCV: 84 fL (ref 79–97)
Platelets: 259 10*3/uL (ref 150–450)
RBC: 4.87 x10E6/uL (ref 3.77–5.28)
RDW: 12.9 % (ref 11.7–15.4)
WBC: 7.9 10*3/uL (ref 3.4–10.8)

## 2022-06-20 LAB — VITAMIN D 25 HYDROXY (VIT D DEFICIENCY, FRACTURES): Vit D, 25-Hydroxy: 52.5 ng/mL (ref 30.0–100.0)

## 2022-06-20 LAB — HEMOGLOBIN A1C
Est. average glucose Bld gHb Est-mCnc: 137 mg/dL
Hgb A1c MFr Bld: 6.4 % — ABNORMAL HIGH (ref 4.8–5.6)

## 2022-06-29 ENCOUNTER — Other Ambulatory Visit: Payer: Medicaid Other

## 2022-07-15 DIAGNOSIS — H43823 Vitreomacular adhesion, bilateral: Secondary | ICD-10-CM | POA: Diagnosis not present

## 2022-07-15 DIAGNOSIS — I10 Essential (primary) hypertension: Secondary | ICD-10-CM | POA: Diagnosis not present

## 2022-07-15 DIAGNOSIS — E119 Type 2 diabetes mellitus without complications: Secondary | ICD-10-CM | POA: Diagnosis not present

## 2022-07-15 DIAGNOSIS — H524 Presbyopia: Secondary | ICD-10-CM | POA: Diagnosis not present

## 2022-07-15 DIAGNOSIS — H35033 Hypertensive retinopathy, bilateral: Secondary | ICD-10-CM | POA: Diagnosis not present

## 2022-07-20 ENCOUNTER — Ambulatory Visit (INDEPENDENT_AMBULATORY_CARE_PROVIDER_SITE_OTHER): Payer: Self-pay

## 2022-07-20 DIAGNOSIS — Z9189 Other specified personal risk factors, not elsewhere classified: Secondary | ICD-10-CM

## 2022-10-24 ENCOUNTER — Ambulatory Visit: Admit: 2022-10-24 | Payer: Medicaid Other

## 2022-10-24 DIAGNOSIS — R0981 Nasal congestion: Secondary | ICD-10-CM | POA: Diagnosis not present

## 2022-10-24 DIAGNOSIS — R059 Cough, unspecified: Secondary | ICD-10-CM | POA: Diagnosis not present

## 2022-10-24 DIAGNOSIS — U071 COVID-19: Secondary | ICD-10-CM | POA: Diagnosis not present

## 2023-01-23 IMAGING — CR DG HIP (WITH OR WITHOUT PELVIS) 2-3V*R*
2 series · 2 of 2 positions shown · non-contrast
Comparison: None.

CLINICAL DATA: Hip pain

EXAM:
DG HIP (WITH OR WITHOUT PELVIS) 2-3V RIGHT

[w pelvis * (1 of 2)]
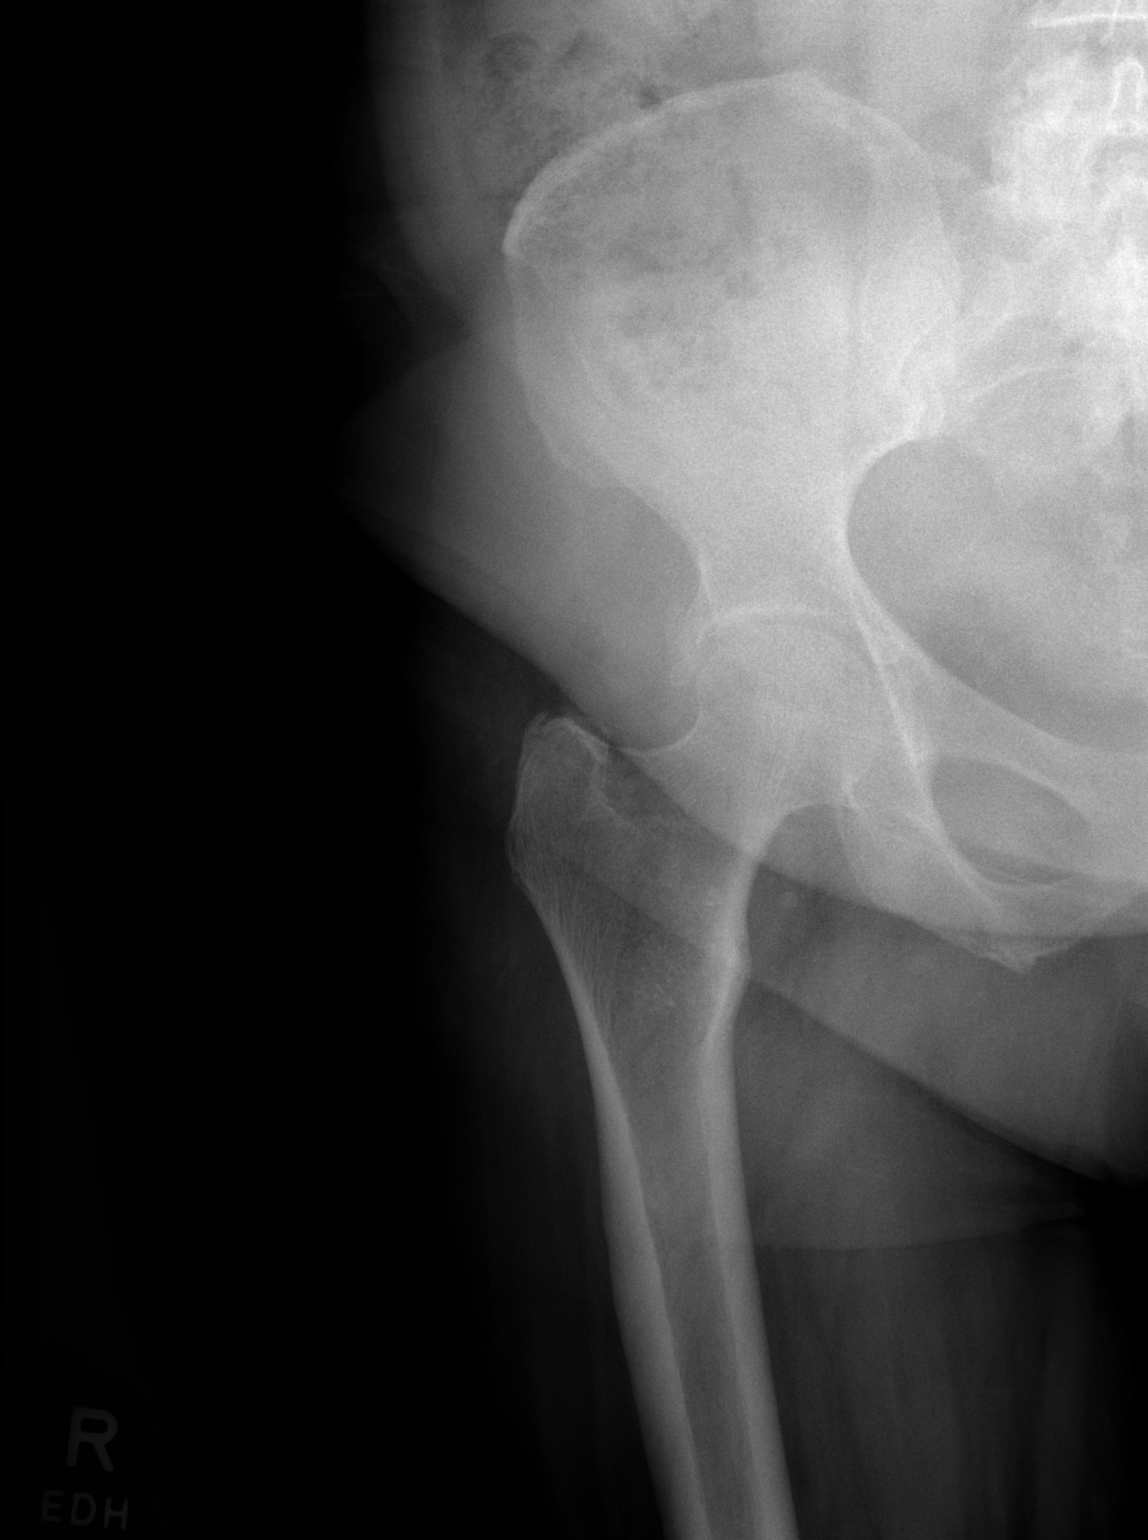

[w pelvis * (2 of 2)]
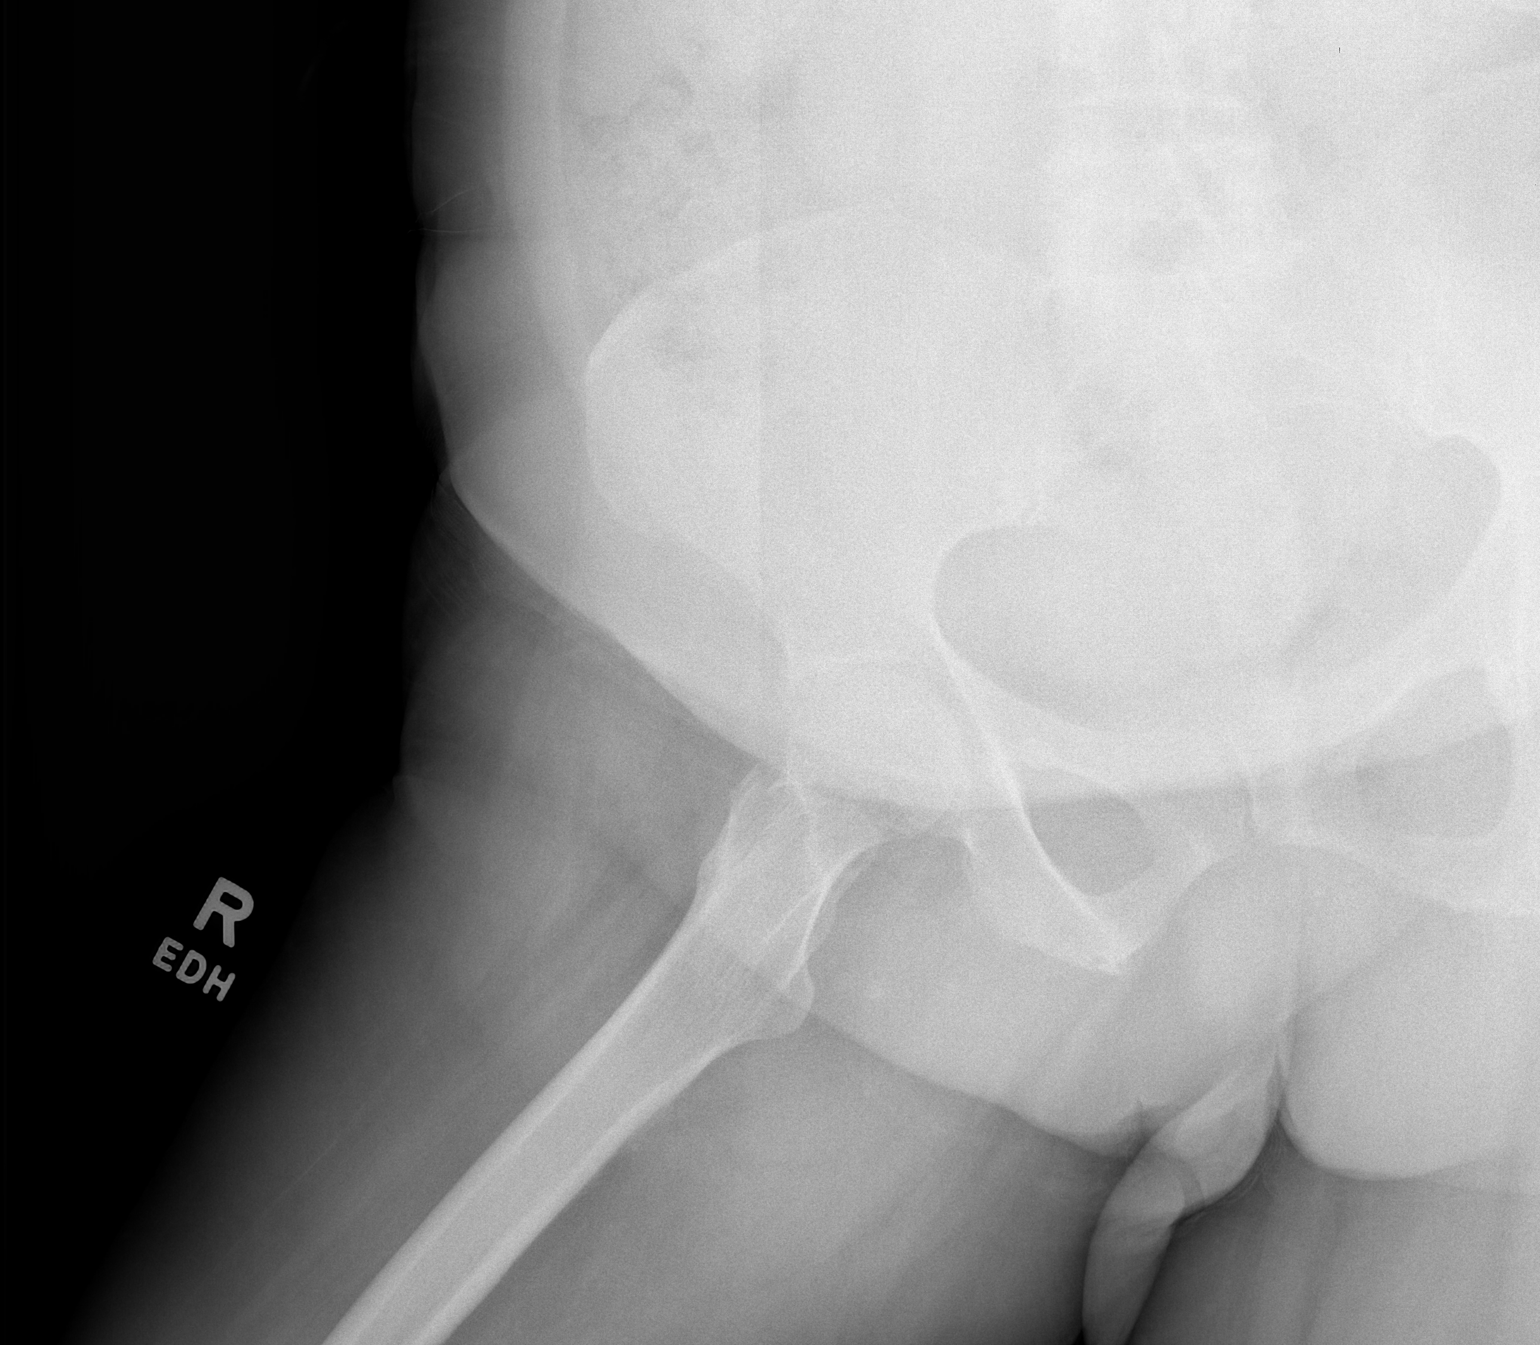

[2 of 2 positions shown; findings below may reference images not displayed]

FINDINGS: No fracture or malalignment. Mild degenerative change with prominent
lateral acetabular osteophytes. Soft tissues are unremarkable
IMPRESSION: Mild degenerative change.

## 2023-01-23 IMAGING — CR DG CERVICAL SPINE COMPLETE 4+V
5 series · 5 of 5 positions shown · non-contrast
Comparison: None.

CLINICAL DATA: Neck pain

EXAM:
CERVICAL SPINE - COMPLETE 4+ VIEW

[w c-spine lat *]
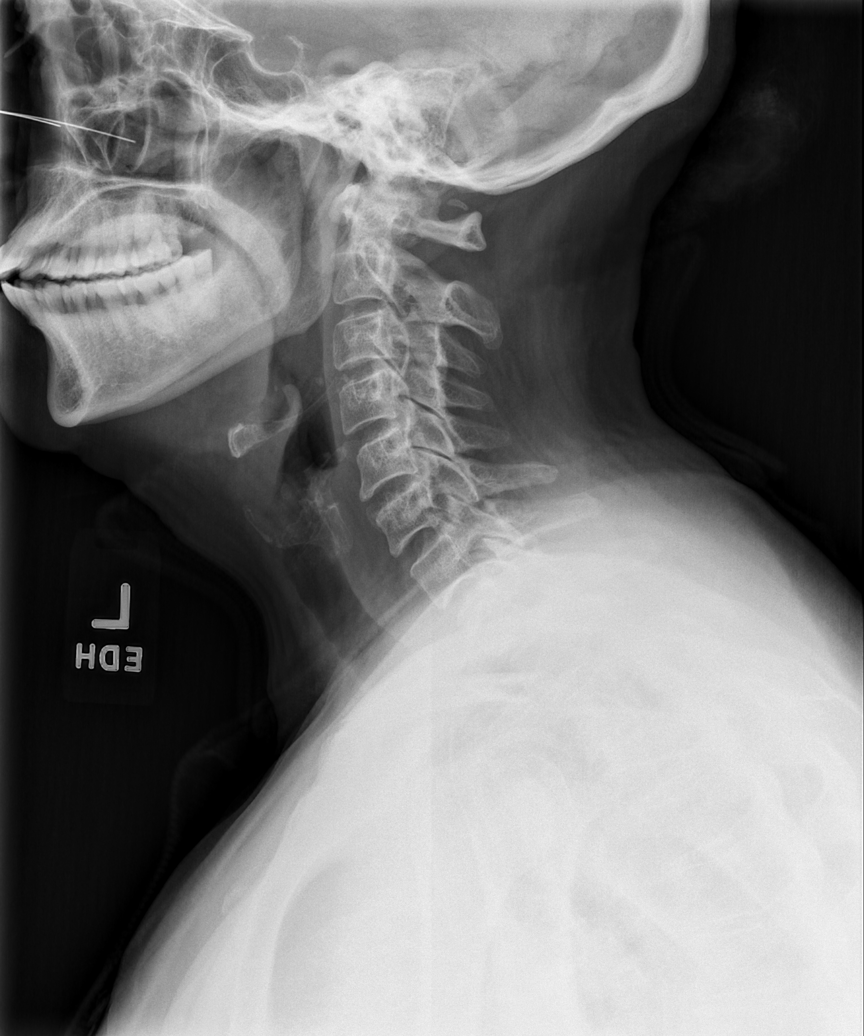

[w c-spine oblique * (1 of 2)]
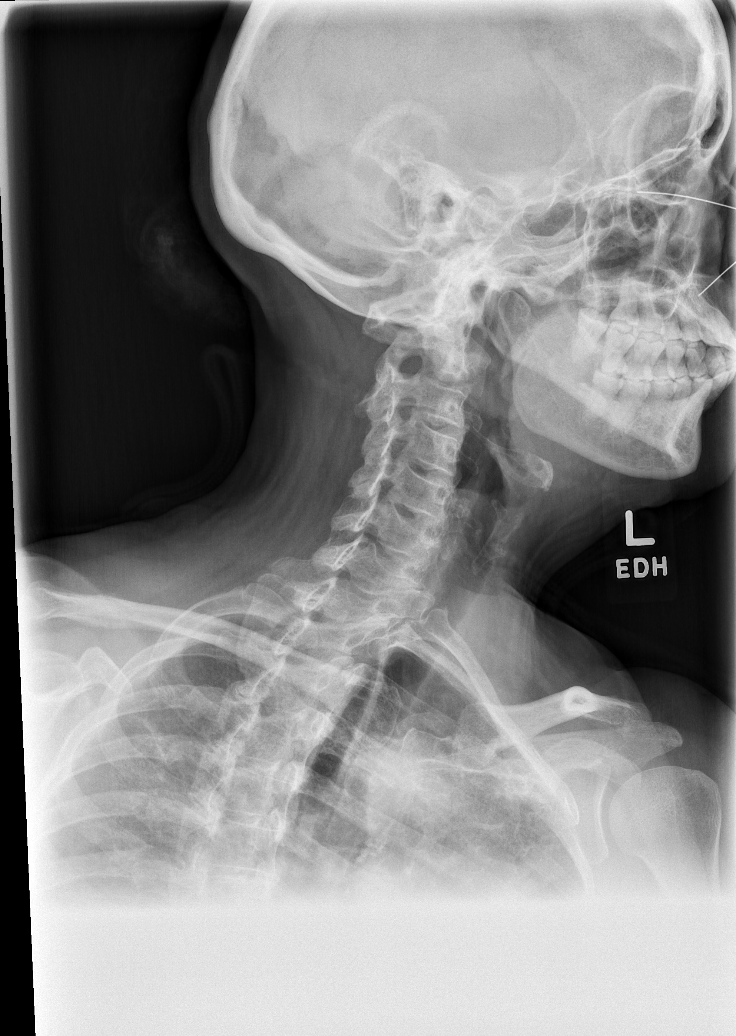

[w c-spine oblique * (2 of 2)]
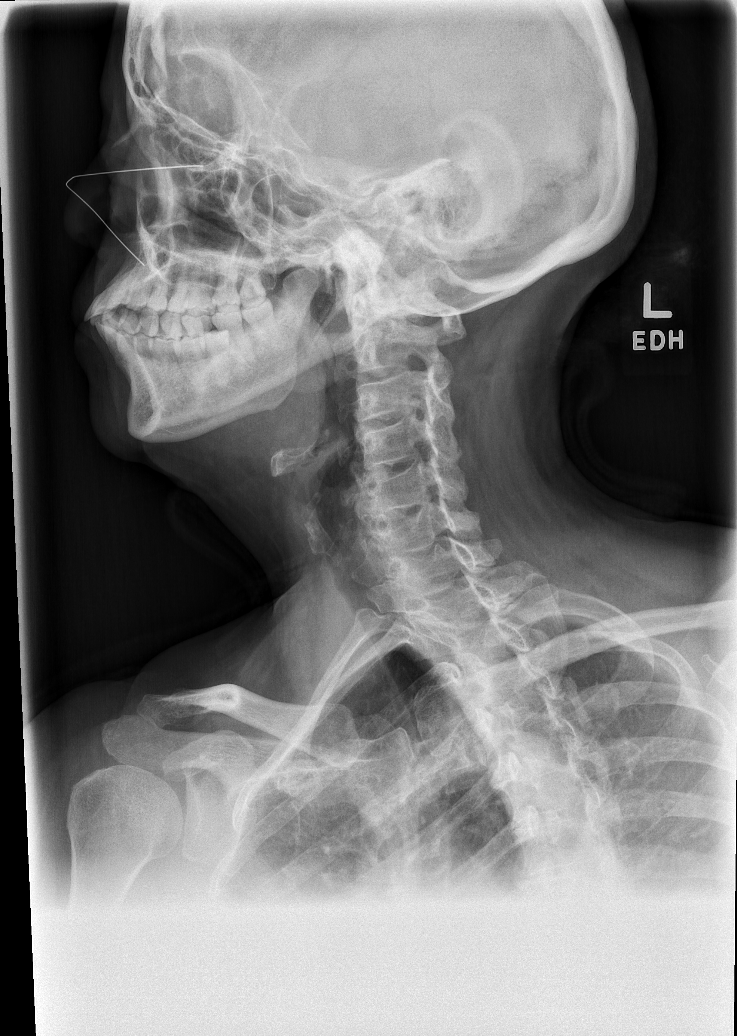

[w c-spine a.p.]
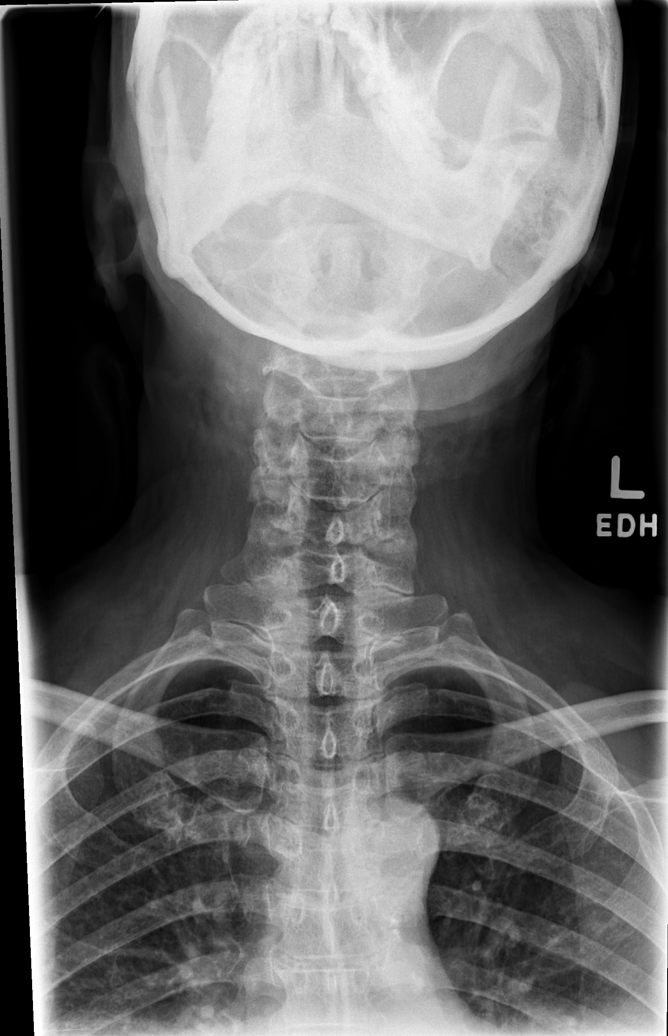

[w c-spine odontoid]
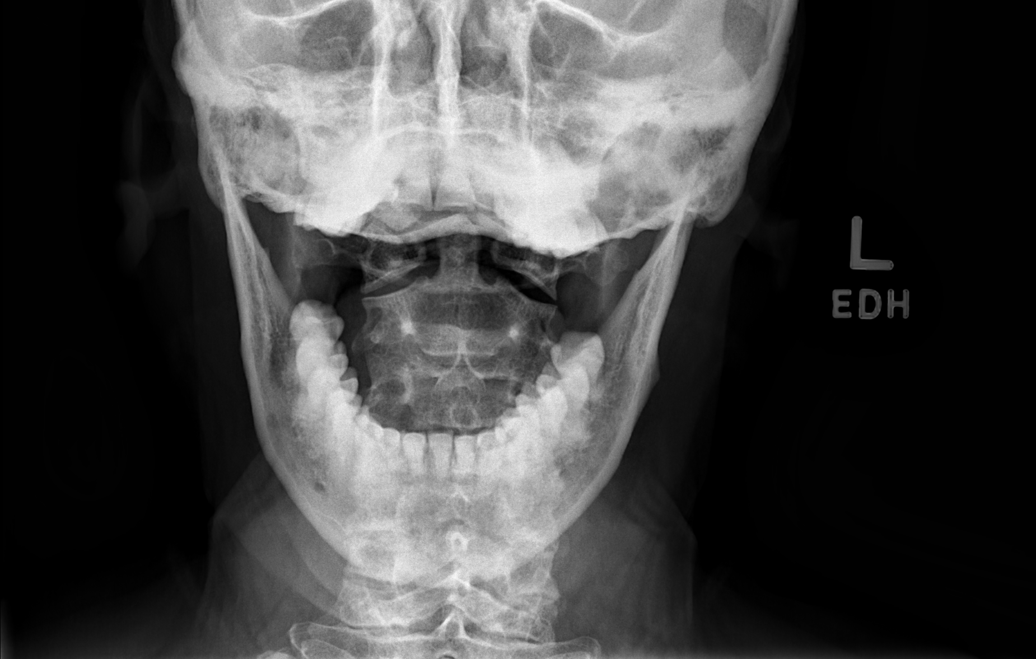

[5 of 5 positions shown; findings below may reference images not displayed]

FINDINGS: Alignment within normal limits. Mild degenerative change C5-C6.
Vertebral body heights are maintained. Dens and lateral masses are
within normal limits.
IMPRESSION: Mild degenerative change at C5-C6.

## 2023-02-02 ENCOUNTER — Ambulatory Visit: Payer: Medicaid Other | Admitting: Family Medicine

## 2023-02-02 DIAGNOSIS — Z9189 Other specified personal risk factors, not elsewhere classified: Secondary | ICD-10-CM

## 2023-02-02 DIAGNOSIS — Z23 Encounter for immunization: Secondary | ICD-10-CM

## 2023-02-02 DIAGNOSIS — Z1211 Encounter for screening for malignant neoplasm of colon: Secondary | ICD-10-CM

## 2023-02-02 DIAGNOSIS — E119 Type 2 diabetes mellitus without complications: Secondary | ICD-10-CM

## 2023-03-01 ENCOUNTER — Ambulatory Visit (INDEPENDENT_AMBULATORY_CARE_PROVIDER_SITE_OTHER): Payer: Medicaid Other | Admitting: Family Medicine

## 2023-03-01 ENCOUNTER — Encounter: Payer: Self-pay | Admitting: Family Medicine

## 2023-03-01 VITALS — BP 139/73 | HR 100 | Ht 60.0 in | Wt 134.0 lb

## 2023-03-01 DIAGNOSIS — I152 Hypertension secondary to endocrine disorders: Secondary | ICD-10-CM | POA: Diagnosis not present

## 2023-03-01 DIAGNOSIS — E1169 Type 2 diabetes mellitus with other specified complication: Secondary | ICD-10-CM | POA: Diagnosis not present

## 2023-03-01 DIAGNOSIS — E119 Type 2 diabetes mellitus without complications: Secondary | ICD-10-CM | POA: Diagnosis not present

## 2023-03-01 DIAGNOSIS — R011 Cardiac murmur, unspecified: Secondary | ICD-10-CM

## 2023-03-01 DIAGNOSIS — E785 Hyperlipidemia, unspecified: Secondary | ICD-10-CM | POA: Diagnosis not present

## 2023-03-01 DIAGNOSIS — E1159 Type 2 diabetes mellitus with other circulatory complications: Secondary | ICD-10-CM

## 2023-03-01 LAB — POCT GLYCOSYLATED HEMOGLOBIN (HGB A1C): HbA1c, POC (controlled diabetic range): 6.5 % (ref 0.0–7.0)

## 2023-03-01 MED ORDER — DICLOFENAC SODIUM 1 % EX GEL: 2.0000 g | Freq: Four times a day (QID) | CUTANEOUS | 1 refills | Status: AC

## 2023-03-01 NOTE — Progress Notes (Signed)
SUBJECTIVE:   CHIEF COMPLAINT / HPI:  Chief Complaint  Patient presents with   Leg Swelling    Here with daughter.  Concerns about bilateral lower extremity swelling.  This has been ongoing, worse after prolonged periods of standing.  Denies chest pain, shortness of breath.  She does have some mild orthopnea which has been an ongoing issue which she reports is getting better.  Has not checked BP at home for a while.  They will be going to [facility] later this summer.  PERTINENT  PMH / PSH: HTN, GERD, T2DM   Patient Care Team: Littie Deeds, MD as PCP - General (Family Medicine)   OBJECTIVE:   BP 139/73   Pulse 100   Ht 5' (1.524 m)   Wt 134 lb (60.8 kg)   SpO2 97%   BMI 26.17 kg/m   Physical Exam Constitutional:      General: She is not in acute distress. HENT:     Head: Normocephalic and atraumatic.     Mouth/Throat:     Mouth: Mucous membranes are moist.     Pharynx: Oropharynx is clear.  Cardiovascular:     Rate and Rhythm: Normal rate and regular rhythm.     Heart sounds: Murmur heard.     Comments: 3/6 systolic murmur Pulmonary:     Effort: Pulmonary effort is normal. No respiratory distress.     Breath sounds: Normal breath sounds.  Musculoskeletal:     Cervical back: Neck supple.     Comments: 1+ pitting edema to bilateral ankles  Skin:    Comments: Questionable hyperpigmentation of bilateral lower extremities  Neurological:     Mental Status: She is alert.         03/01/2023    4:39 PM  Depression screen PHQ 2/9  Decreased Interest 0  Down, Depressed, Hopeless 0  PHQ - 2 Score 0  Altered sleeping 0  Tired, decreased energy 0  Change in appetite 0  Feeling bad or failure about yourself  0  Trouble concentrating 0  Moving slowly or fidgety/restless 0  Suicidal thoughts 0  PHQ-9 Score 0     {Show previous vital signs (optional):23777}  Last hemoglobin A1c Lab Results  Component Value Date   HGBA1C 6.5 03/01/2023       ASSESSMENT/PLAN:   Problem List Items Addressed This Visit       Cardiovascular and Mediastinum   Hypertension associated with type 2 diabetes mellitus (HCC)    Adequately controlled, improved on repeat.  Continue current regimen of amlodipine 10 mg, HCTZ 25 mg.  Advised to monitor BP at home.      Relevant Orders   Basic Metabolic Panel     Endocrine   Hyperlipidemia associated with type 2 diabetes mellitus (HCC)    Patient has previously declined statin in the past.  Will repeat lipid panel today, strongly recommended statin given history of diabetes.  Will follow-up after lab results.      Relevant Orders   Lipid Panel   Type II diabetes mellitus (HCC) - Primary    Diet controlled, remains at goal      Relevant Orders   HgB A1c (Completed)   Microalbumin/Creatinine Ratio, Urine     Other   Undiagnosed cardiac murmurs    Previously worked up and had normal echocardiogram in 2019.  However given ongoing orthopnea and worsening lower extremity edema, will repeat echocardiogram.          Return in about  3 months (around 06/01/2023) for f/u HTN.   Littie Deeds, MD [facility]

## 2023-03-01 NOTE — Assessment & Plan Note (Signed)
Adequately controlled, improved on repeat.  Continue current regimen of amlodipine 10 mg, HCTZ 25 mg.  Advised to monitor BP at home.

## 2023-03-01 NOTE — Assessment & Plan Note (Signed)
Patient has previously declined statin in the past.  Will repeat lipid panel today, strongly recommended statin given history of diabetes.  Will follow-up after lab results.

## 2023-03-01 NOTE — Assessment & Plan Note (Signed)
Previously worked up and had normal echocardiogram in 2019.  However given ongoing orthopnea and worsening lower extremity edema, will repeat echocardiogram.

## 2023-03-01 NOTE — Patient Instructions (Addendum)
It was nice seeing you today!  I recommend getting a blood pressure cuff and checking your blood pressure at least a few times a week.  If your cholesterol is still elevated, I would strongly consider starting a cholesterol-lowering medication called a statin.  Go to your echocardiogram as scheduled.  Stay well, Stacy Deeds, MD [facility] [phone]  --  Make sure to check out at the front desk before you leave today.  Please arrive at least 15 minutes prior to your scheduled appointments.  If you had blood work today, I will send you a MyChart message or a letter if results are normal. Otherwise, I will give you a call.  If you had a referral placed, they will call you to set up an appointment. Please give [place] a call if you don't hear back in the next 2 weeks.  If you need additional refills before your next appointment, please call your pharmacy first.

## 2023-03-01 NOTE — Assessment & Plan Note (Signed)
Diet controlled, remains at goal

## 2023-03-02 LAB — MICROALBUMIN / CREATININE URINE RATIO
Creatinine, Urine: 27 mg/dL
Microalb/Creat Ratio: 29 mg/g creat (ref 0–29)
Microalbumin, Urine: 7.8 ug/mL

## 2023-03-08 ENCOUNTER — Other Ambulatory Visit: Payer: Medicaid Other

## 2023-03-08 DIAGNOSIS — E785 Hyperlipidemia, unspecified: Secondary | ICD-10-CM | POA: Diagnosis not present

## 2023-03-08 DIAGNOSIS — E1159 Type 2 diabetes mellitus with other circulatory complications: Secondary | ICD-10-CM

## 2023-03-08 DIAGNOSIS — E1169 Type 2 diabetes mellitus with other specified complication: Secondary | ICD-10-CM | POA: Diagnosis not present

## 2023-03-08 DIAGNOSIS — I152 Hypertension secondary to endocrine disorders: Secondary | ICD-10-CM | POA: Diagnosis not present

## 2023-03-09 ENCOUNTER — Telehealth: Payer: Self-pay | Admitting: Family Medicine

## 2023-03-09 DIAGNOSIS — E1169 Type 2 diabetes mellitus with other specified complication: Secondary | ICD-10-CM

## 2023-03-09 LAB — BASIC METABOLIC PANEL
BUN/Creatinine Ratio: 17 (ref 12–28)
BUN: 14 mg/dL (ref 8–27)
CO2: 21 mmol/L (ref 20–29)
Calcium: 9.7 mg/dL (ref 8.7–10.3)
Chloride: 102 mmol/L (ref 96–106)
Creatinine, Ser: 0.82 mg/dL (ref 0.57–1.00)
Glucose: 112 mg/dL — ABNORMAL HIGH (ref 70–99)
Potassium: 3.7 mmol/L (ref 3.5–5.2)
Sodium: 141 mmol/L (ref 134–144)
eGFR: 78 mL/min/{1.73_m2} (ref >59)

## 2023-03-09 LAB — LIPID PANEL
Chol/HDL Ratio: 3.9 ratio (ref 0.0–4.4)
Cholesterol, Total: 205 mg/dL — ABNORMAL HIGH (ref 100–199)
HDL: 53 mg/dL (ref >39)
LDL Chol Calc (NIH): 126 mg/dL — ABNORMAL HIGH (ref 0–99)
Triglycerides: 145 mg/dL (ref 0–149)
VLDL Cholesterol Cal: 26 mg/dL (ref 5–40)

## 2023-03-09 MED ORDER — ATORVASTATIN CALCIUM 20 MG PO TABS
20.0000 mg | ORAL_TABLET | Freq: Every day | ORAL | 3 refills | Status: DC
Start: 2023-03-09 — End: 2023-08-26

## 2023-03-09 NOTE — Telephone Encounter (Signed)
Called patient to discuss lab results, spoke with patient's daughter.  Lipid panel shows LDL is still slightly elevated.  Given diabetes I again recommended that she start a statin medication.  She was amenable to this now, daughter will talk to her about the medication.  Prescription for atorvastatin 20 mg sent in.

## 2023-03-11 ENCOUNTER — Ambulatory Visit (HOSPITAL_COMMUNITY): Payer: Medicaid Other

## 2023-03-12 ENCOUNTER — Ambulatory Visit (HOSPITAL_COMMUNITY)
Admission: RE | Admit: 2023-03-12 | Discharge: 2023-03-12 | Disposition: A | Discharge status: Home / Self Care | Payer: Medicaid Other | Source: Ambulatory Visit | Attending: Family Medicine | Admitting: Family Medicine

## 2023-03-12 DIAGNOSIS — R011 Cardiac murmur, unspecified: Secondary | ICD-10-CM | POA: Diagnosis not present

## 2023-03-12 DIAGNOSIS — R600 Localized edema: Secondary | ICD-10-CM | POA: Diagnosis not present

## 2023-03-12 DIAGNOSIS — E785 Hyperlipidemia, unspecified: Secondary | ICD-10-CM | POA: Insufficient documentation

## 2023-03-12 DIAGNOSIS — I119 Hypertensive heart disease without heart failure: Secondary | ICD-10-CM | POA: Insufficient documentation

## 2023-03-12 LAB — ECHOCARDIOGRAM COMPLETE
Area-P 1/2: 3.31 cm2
Calc EF: 73.9 %
S' Lateral: 2.4 cm
Single Plane A2C EF: 78 %
Single Plane A4C EF: 69.9 %

## 2023-03-12 NOTE — Progress Notes (Signed)
  Echocardiogram 2D Echocardiogram has been performed.  Janalyn Harder 03/12/2023, 10:49 AM

## 2023-04-09 ENCOUNTER — Other Ambulatory Visit: Payer: Self-pay

## 2023-04-12 MED ORDER — AMLODIPINE BESYLATE 10 MG PO TABS: 10.0000 mg | ORAL_TABLET | Freq: Every day | ORAL | 1 refills | Status: DC

## 2023-04-16 ENCOUNTER — Other Ambulatory Visit: Payer: Self-pay

## 2023-04-16 MED ORDER — HYDROCHLOROTHIAZIDE 25 MG PO TABS: 25.0000 mg | ORAL_TABLET | Freq: Every day | ORAL | 1 refills | Status: DC

## 2023-05-31 ENCOUNTER — Other Ambulatory Visit: Payer: Self-pay | Admitting: Family Medicine

## 2023-05-31 DIAGNOSIS — Z1231 Encounter for screening mammogram for malignant neoplasm of breast: Secondary | ICD-10-CM

## 2023-06-16 ENCOUNTER — Ambulatory Visit
Admission: RE | Admit: 2023-06-16 | Discharge: 2023-06-16 | Disposition: A | Discharge status: Home / Self Care | Payer: Medicaid Other | Source: Ambulatory Visit

## 2023-06-16 DIAGNOSIS — Z1231 Encounter for screening mammogram for malignant neoplasm of breast: Secondary | ICD-10-CM | POA: Diagnosis not present

## 2023-07-02 DIAGNOSIS — L299 Pruritus, unspecified: Secondary | ICD-10-CM | POA: Diagnosis not present

## 2023-07-02 DIAGNOSIS — L853 Xerosis cutis: Secondary | ICD-10-CM | POA: Diagnosis not present

## 2023-08-23 ENCOUNTER — Telehealth: Payer: Self-pay

## 2023-08-23 NOTE — Telephone Encounter (Signed)
Daughter calls nurse line requesting an apt for her mother.   She reports she has been experiencing mid line chest discomfort for ~ 1 week. She denies any trauma to the area.   She denies any left sided chest pain or left sided arm discomfort. She denies any headaches, vision changes, dizziness, nausea/vomiting or SOB.   Daughter requests an apt on 12/5.  Patient scheduled for 12/5 With [facility].   Precautions discussed in the meantime for a sooner evaluation.

## 2023-08-26 ENCOUNTER — Encounter: Payer: Self-pay | Admitting: Family Medicine

## 2023-08-26 ENCOUNTER — Ambulatory Visit (HOSPITAL_COMMUNITY)
Admission: RE | Admit: 2023-08-26 | Discharge: 2023-08-26 | Disposition: A | Discharge status: Home / Self Care | Payer: Medicaid Other | Source: Ambulatory Visit | Attending: Family Medicine | Admitting: Family Medicine

## 2023-08-26 ENCOUNTER — Ambulatory Visit (INDEPENDENT_AMBULATORY_CARE_PROVIDER_SITE_OTHER): Payer: Medicaid Other | Admitting: Family Medicine

## 2023-08-26 VITALS — BP 160/77 | HR 98 | Ht 60.0 in | Wt 138.0 lb

## 2023-08-26 DIAGNOSIS — R079 Chest pain, unspecified: Secondary | ICD-10-CM

## 2023-08-26 DIAGNOSIS — E785 Hyperlipidemia, unspecified: Secondary | ICD-10-CM

## 2023-08-26 DIAGNOSIS — L299 Pruritus, unspecified: Secondary | ICD-10-CM

## 2023-08-26 DIAGNOSIS — E1169 Type 2 diabetes mellitus with other specified complication: Secondary | ICD-10-CM

## 2023-08-26 MED ORDER — AMLODIPINE BESYLATE 10 MG PO TABS: 10.0000 mg | ORAL_TABLET | Freq: Every day | ORAL | 1 refills | Status: DC

## 2023-08-26 MED ORDER — ATORVASTATIN CALCIUM 20 MG PO TABS: 20.0000 mg | ORAL_TABLET | Freq: Every day | ORAL | 3 refills | Status: DC

## 2023-08-26 MED ORDER — HYDROCHLOROTHIAZIDE 25 MG PO TABS: 25.0000 mg | ORAL_TABLET | Freq: Every day | ORAL | 1 refills | Status: DC

## 2023-08-26 NOTE — Patient Instructions (Signed)
Good to see you today - Thank you for coming in  Things we discussed today:  1) Your back and chest pain is most likely due to muscle strain. Your EKG looked normal, so your heart is doing well. - Take ibuprofen or tylenol as needed for pain. - You can apply a heat pack to the area on her upper back to help with pain  2) For her itching, - You take 25mg  of Diphenhydramine (bendaryl) at nighttime to help with itching if needed. This can cause drowsiness, so avoid taking it during the day.  Come back to see me in 2 weeks to discuss sleep and diabetes

## 2023-08-26 NOTE — Progress Notes (Signed)
SUBJECTIVE:   CHIEF COMPLAINT / HPI:   Stacy Mccormick is a 67 year old with PMHx of GERD, HTN, HLD presenting with 2 week history of intermittent substernal chest pain.  Chest Pain  This is a new problem. Episode onset: 2 weeks. No precipitating factors such as eating or lifting heavy. The onset quality is sudden. The problem occurs intermittently. The problem has been unchanged. The pain is present in the substernal region (and radiates to back). The pain is at a severity of 0/10 (currently no chest pain. Last episode on 12/1.). The pain is moderate. Quality: uncharacterized. The pain radiates to the upper back and left neck. Associated symptoms include back pain, exertional chest pressure, palpitations and shortness of breath. Pertinent negatives include no abdominal pain, diaphoresis, dizziness, nausea, numbness or vomiting. Associated with: lying down supine. She has tried acetaminophen for the symptoms. The treatment provided mild relief. Risk factors include being elderly and post-menopausal.  Her past medical history is significant for hyperlipidemia and hypertension.  Pertinent negatives for past medical history include no anxiety/panic attacks, no strokes and no thyroid problem.    Neck Pruritus: Patient states this is a chronic issue that has been going on for years. She is dissatisfied with dermatology visits thus far. Reports they previously gave a medicine that made her very drowsy and they did not like it (potentially atarax?). The itching is all over her body, including legs and face. She has also been using topical hydrocortisone cream.   Refills Needed: Amlodipine, hydrochlorothiazide, atorvastatin  PERTINENT  PMH / PSH: GERD, HTN, HLD   OBJECTIVE:   BP (!) 157/78   Pulse (!) 104   Ht 5' (1.524 m)   Wt 138 lb (62.6 kg)   SpO2 93%   BMI 26.95 kg/m   Physical Exam Constitutional:      Appearance: She is well-developed.  Cardiovascular:     Rate and Rhythm:  Normal rate and regular rhythm.     Heart sounds: Normal heart sounds.  Pulmonary:     Effort: Pulmonary effort is normal.     Breath sounds: Normal breath sounds.  Chest:     Chest wall: Tenderness present.  Abdominal:     General: Bowel sounds are normal.     Palpations: Abdomen is soft.     Tenderness: There is no abdominal tenderness.  Musculoskeletal:        General: Normal range of motion.     Right lower leg: No edema.     Left lower leg: No edema.     Comments: Chest wall and upper back TTP  Skin:    Capillary Refill: Capillary refill takes less than 2 seconds.     Findings: No erythema or rash.     Comments: No scaling present  Neurological:     Mental Status: She is alert.      ASSESSMENT/PLAN:   Stacy Mccormick is a 67 year old with PMHx of GERD, HTN, HLD presenting with 2 week history of intermittent substernal chest pain.   Chest Pain Currently, her chest pain is assessed as stable. She has not experienced any chest pain since 12/1 and it has been intermittent within the past two weeks. Etiology for chest pain is extensive and involves several organ systems. My differential diagnosis includes costochondritis, stable angina, GERD, PE, MI and aortic dissection. Less likely to be PE given the absence of unilateral leg swelling or pleuritic chest pain, and her Wells score is 1.5. Normal  sinus rhythm on EKG without ST elevations rules out an ACS and is reassuring. Aortic dissection is included due to back pain, however this is less likely given that patient has never been a smoker. Leading diagnosis is costochondritis given the reassuring EKG as well as chest wall tenderness reproducibility on exam. Patient was counseled to continue using tylenol every 6 hours for inflammation. If chest pain persists after 4 weeks or becomes associated with exertion, will consider broadening differential to stable angina due to her history of HLD. At time of follow up, if unresolved chest  pain, can consider imaging and/or sublingual nitroglycerin PRN.  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 Differential diagnosis for dry skin includes xerosis, environmental, psoriasis, DM and cholestasis. Psoriasis is less likely to present in for the first time in her age group, and her skin does not show any gray scaling characteristic to psoriasis on extensor surfaces. DM is less likely given patient does not have a current diagnosis, however, given recent high A1C, will encourage follow up to ensure this does not develop. Cholestasis is less likely given the absence of previous liver disorder or any symptoms of bile overload. Leading etiology is xerosis, as she is post-menopausal age, skin thins and loses natural moisture retention and is prone to dryness. This is also like multifactorial given environmental factors such as cold, dry air given we are in winter season. Recommend that she use emollients such as thick lotion, avoid tight clothingand also avoid harsh soaps and detergents.   Health Maintenance: Ensured refills of Amlodipine, hydrochlorothiazide, atorvastatin as requested.  FOLLOW UP: She will be seen in 2 weeks for follow up on A1C results and also occurrence of occasional nightmares and sleeping issues. Can also reassess if chest pain returns at this visit.   Oscar La, Medical Student [facility]   I was personally present and performed or re-performed the history, physical exam and medical decision making activities of this service and have verified that the service and findings are accurately documented in the student's note.  Additionally: - Pt had tenderness to palpation along sternum, suggesting MSK etiology. Pt also having thoracic back pain and tenderness of thoracic back, also most likely MSK given reproducibility on exam.   Assessment & Plan Chest pain, unspecified type - Most likely MSK as above. Recommended conservative  management w/ prn ibuprofen and tylenol Generalized pruritus - prn benadryl and encouraged moisturizing  - cont prn topical hydrocortisone, advised to avoid face area    Lincoln Brigham, MD                  08/26/2023, 4:49 PM

## 2023-09-09 DIAGNOSIS — H5213 Myopia, bilateral: Secondary | ICD-10-CM | POA: Diagnosis not present

## 2023-09-21 DIAGNOSIS — L853 Xerosis cutis: Secondary | ICD-10-CM | POA: Diagnosis not present

## 2023-09-21 DIAGNOSIS — L821 Other seborrheic keratosis: Secondary | ICD-10-CM | POA: Diagnosis not present

## 2023-09-21 DIAGNOSIS — L299 Pruritus, unspecified: Secondary | ICD-10-CM | POA: Diagnosis not present

## 2023-09-24 ENCOUNTER — Ambulatory Visit: Payer: Medicaid Other | Admitting: Family Medicine

## 2023-10-21 ENCOUNTER — Ambulatory Visit: Payer: Medicaid Other | Admitting: Family Medicine

## 2023-10-29 ENCOUNTER — Ambulatory Visit (INDEPENDENT_AMBULATORY_CARE_PROVIDER_SITE_OTHER): Payer: Medicaid Other | Admitting: Family Medicine

## 2023-10-29 ENCOUNTER — Encounter: Payer: Self-pay | Admitting: Family Medicine

## 2023-10-29 VITALS — BP 128/75 | HR 98 | Ht 60.0 in | Wt 132.0 lb

## 2023-10-29 DIAGNOSIS — I951 Orthostatic hypotension: Secondary | ICD-10-CM | POA: Diagnosis not present

## 2023-10-29 DIAGNOSIS — G8929 Other chronic pain: Secondary | ICD-10-CM

## 2023-10-29 DIAGNOSIS — I152 Hypertension secondary to endocrine disorders: Secondary | ICD-10-CM

## 2023-10-29 DIAGNOSIS — E119 Type 2 diabetes mellitus without complications: Secondary | ICD-10-CM

## 2023-10-29 DIAGNOSIS — L299 Pruritus, unspecified: Secondary | ICD-10-CM | POA: Insufficient documentation

## 2023-10-29 DIAGNOSIS — E1159 Type 2 diabetes mellitus with other circulatory complications: Secondary | ICD-10-CM | POA: Diagnosis not present

## 2023-10-29 DIAGNOSIS — R519 Headache, unspecified: Secondary | ICD-10-CM | POA: Diagnosis not present

## 2023-10-29 LAB — POCT GLYCOSYLATED HEMOGLOBIN (HGB A1C): HbA1c, POC (controlled diabetic range): 6.7 % (ref 0.0–7.0)

## 2023-10-29 MED ORDER — HYDROCORTISONE 0.5 % EX CREA
1.0000 | TOPICAL_CREAM | Freq: Three times a day (TID) | CUTANEOUS | 2 refills | Status: AC
Start: 2023-10-29 — End: ?

## 2023-10-29 MED ORDER — AMLODIPINE BESYLATE 10 MG PO TABS: 10.0000 mg | ORAL_TABLET | Freq: Every day | ORAL | 1 refills | Status: DC

## 2023-10-29 MED ORDER — HYDROCHLOROTHIAZIDE 25 MG PO TABS: 25.0000 mg | ORAL_TABLET | Freq: Every day | ORAL | 1 refills | Status: DC

## 2023-10-29 NOTE — Patient Instructions (Addendum)
 It was wonderful to see you today!  Today we discussed your diabetes, and ear itching/headaches. We checked your A1c, which was 6.7, which is wonderful. Keep up the good work with your diet, and we will recheck in 6 months.  For your headaches I would like you to keep a headache journal.  Every time you have a headache for the next month I would like you to write down what you are doing, where you feel the pain, and if it goes away with Tylenol and a nap.  This will give us  a better idea of the pattern of her headaches and whether or not we need to start a different treatment.  Your dizziness is caused by something called orthostatic hypotension.  This is something that frequently happens as we get older and are veins and muscles are not as responsive as they used to be during position changes.  You should be very careful when standing up and allow yourself a moment to adjust.  You should also try to stay well-hydrated.  For an adult this means drinking three 8 ounce glasses of water at least every day.  For your ear itching, I have prescribed hydrocortisone  cream.  You can use this up to 4 times a day on the area that is itchy.  When you shower please make sure you thoroughly rinse your ears with water to avoid soap buildup as this can also make your ears itchy.   I will follow-up with you in 1 month to look over your headache journal discuss further treatment.  Please call [phone] with any questions about today's appointment.   If you need any additional refills, please call your pharmacy before calling the office.  Lucie Pinal, DO Family Medicine

## 2023-10-29 NOTE — Progress Notes (Addendum)
 SUBJECTIVE:   CHIEF COMPLAINT / HPI:   Wants to discuss T2DM and insomnia Current regimen: Nothing, no previous meds noted on chart review. Last A1c: 6.5, 8 months ago  Ear itching/headache Has been having ongoing itching and pain inside of her ear for several years now.  Also has associated right sided headaches and sometimes her eyes water along with these headaches.  She is able to break the headache with Tylenol and rest.  She describes them as 1 intense spike, and she does not have a time where she does not take Tylenol for her headaches.  She denies any visual aura, photophobia, phonophobia, nausea vomiting.  She does report subjective fevers with her headaches but denies any recorded fevers.  Weakness/dizziness She also reports having dizziness on standing for several years as well as some right sided weakness.  Does not feel dizzy at any other times.  Insomnia Described as difficulty falling asleep, and frequent nightmares when she is asleep.  Deferred further evaluation for next visit.  PERTINENT  PMH / PSH: HTN, T2DM, HLD  OBJECTIVE:   BP 128/75   Pulse 98   Ht 5' (1.524 m)   Wt 132 lb (59.9 kg)   SpO2 97%   BMI 25.78 kg/m   General: Well-appearing well-nourished elderly woman, no distress Cardiac: RRR, no M/R/G HEENT: normocephalic atraumatic, PERRLA EOMI. right external ear with small patch of excoriated skin, external auditory canal without swelling erythema or defect.  TM without bulging or erythema Neuro: Normal gait and balance.  5 out of 5 strength in the bilateral upper extremity at the shoulder elbow and wrist.  Grip strength 5 out of 5 bilaterally.  Bilateral lower extremity with 5 out of 5 strength at the ankle and knee.  Left lower extremity 5 out of 5 strength at the hip, right lower extremity 4 out of 5 strength at the hip.  Orthostatic vital signs: Standing-  pulse 95, BP 158/73 Laying-pulse 91 BP 147/75 Standing pulse 96 BP  137/62  ASSESSMENT/PLAN:   Type II diabetes mellitus (HCC) Hgb A1c in office today was 6.7.  Patient is well-controlled with dietary interventions.  Will plan to recheck in 6 months  Chronic right-sided headaches Advised patient to keep a headache diary for the next month where she writes down what she is doing when her headache starts, where she feels the pain, what the pain feels like, and whether or not Tylenol leaves her pain.  Will follow-up with her in approximately 1 month to go over her headache diary and make a further determination.  I am reassured that her neuro exam is benign, and that these headaches have been consistent in character over the course of years.  If she had a concerning problem such as an intracranial mass or temporal arteritis she would have far greater symptoms or consequences at this point.  There is a chance that this could be trigeminal neuralgia as she does have autonomic symptoms such as subjective fevers and tearing along with her headaches, however without a better idea of the pattern I do not feel comfortable prescribing her carbamazepine at this time.  Ear itching Prescribed hydrocortisone  0.5% to be used up to 3 times a day on the affected area.  Also advised patient and her daughter to be sure that any soap is thoroughly washed away from the area as buildup can cause itching.  Orthostatic hypotension Advised patient to maintain adequate hydration, and to rise from a sitting position slowly to  allow her body time to adjust.  Provided counseling on the mechanism of orthostasis for older adults.  Provided reassurance that this was nothing frightening.   Lucie Pinal, DO [facility]

## 2023-10-29 NOTE — Assessment & Plan Note (Signed)
 Advised patient to keep a headache diary for the next month where she writes down what she is doing when her headache starts, where she feels the pain, what the pain feels like, and whether or not Tylenol leaves her pain.  Will follow-up with her in approximately 1 month to go over her headache diary and make a further determination.  I am reassured that her neuro exam is benign, and that these headaches have been consistent in character over the course of years.  If she had a concerning problem such as an intracranial mass or temporal arteritis she would have far greater symptoms or consequences at this point.  There is a chance that this could be trigeminal neuralgia as she does have autonomic symptoms such as subjective fevers and tearing along with her headaches, however without a better idea of the pattern I do not feel comfortable prescribing her carbamazepine at this time.

## 2023-10-29 NOTE — Assessment & Plan Note (Signed)
 Advised patient to maintain adequate hydration, and to rise from a sitting position slowly to allow her body time to adjust.  Provided counseling on the mechanism of orthostasis for older adults.  Provided reassurance that this was nothing frightening.

## 2023-10-29 NOTE — Assessment & Plan Note (Addendum)
 Hgb A1c in office today was 6.7.  Patient is well-controlled with dietary interventions.  Will plan to recheck in 6 months

## 2023-10-29 NOTE — Assessment & Plan Note (Signed)
 Prescribed hydrocortisone  0.5% to be used up to 3 times a day on the affected area.  Also advised patient and her daughter to be sure that any soap is thoroughly washed away from the area as buildup can cause itching.

## 2023-12-06 ENCOUNTER — Ambulatory Visit: Payer: Medicaid Other | Admitting: Family Medicine

## 2023-12-06 NOTE — Progress Notes (Deleted)
    SUBJECTIVE:   CHIEF COMPLAINT / HPI:   Headache follow up  Ddx to date: Tension type headache Trigeminal neuralgia Hypertension? Migraines Sinusitis  PERTINENT  PMH / PSH: ***  OBJECTIVE:   There were no vitals taken for this visit.  ***  ASSESSMENT/PLAN:   No problem-specific Assessment & Plan notes found for this encounter.     Gerrit Heck, DO [facility]

## 2024-07-13 ENCOUNTER — Other Ambulatory Visit: Payer: Self-pay | Admitting: Family Medicine

## 2024-07-13 DIAGNOSIS — Z1231 Encounter for screening mammogram for malignant neoplasm of breast: Secondary | ICD-10-CM

## 2024-08-07 ENCOUNTER — Ambulatory Visit: Admitting: Family Medicine

## 2024-08-07 ENCOUNTER — Ambulatory Visit
Admission: RE | Admit: 2024-08-07 | Discharge: 2024-08-07 | Disposition: A | Source: Ambulatory Visit | Attending: Family Medicine | Admitting: Family Medicine

## 2024-08-07 ENCOUNTER — Encounter: Payer: Self-pay | Admitting: Family Medicine

## 2024-08-07 ENCOUNTER — Ambulatory Visit

## 2024-08-07 VITALS — BP 141/80 | HR 81 | Ht 60.0 in | Wt 132.6 lb

## 2024-08-07 DIAGNOSIS — E1159 Type 2 diabetes mellitus with other circulatory complications: Secondary | ICD-10-CM | POA: Diagnosis not present

## 2024-08-07 DIAGNOSIS — Z1231 Encounter for screening mammogram for malignant neoplasm of breast: Secondary | ICD-10-CM | POA: Diagnosis not present

## 2024-08-07 DIAGNOSIS — I152 Hypertension secondary to endocrine disorders: Secondary | ICD-10-CM | POA: Diagnosis not present

## 2024-08-07 DIAGNOSIS — E119 Type 2 diabetes mellitus without complications: Secondary | ICD-10-CM

## 2024-08-07 DIAGNOSIS — Z13820 Encounter for screening for osteoporosis: Secondary | ICD-10-CM

## 2024-08-07 DIAGNOSIS — E1169 Type 2 diabetes mellitus with other specified complication: Secondary | ICD-10-CM

## 2024-08-07 DIAGNOSIS — Z1211 Encounter for screening for malignant neoplasm of colon: Secondary | ICD-10-CM | POA: Diagnosis not present

## 2024-08-07 DIAGNOSIS — E785 Hyperlipidemia, unspecified: Secondary | ICD-10-CM | POA: Diagnosis not present

## 2024-08-07 LAB — POCT GLYCOSYLATED HEMOGLOBIN (HGB A1C): HbA1c, POC (controlled diabetic range): 7 % (ref 0.0–7.0)

## 2024-08-07 NOTE — Patient Instructions (Addendum)
 It was wonderful to see you today!  Your blood pressure was not at goal today. Please continue to take your medications every day, even when you feel well, as high blood pressure won't make you feel sick most of the time.   We made the following changes to your medications:  - no medication changes today, but we will follow up in 4 weeks  We checked the following labs today:  - BMP - Lipid Panel - Urine ACR - Hgb A1c - Vitamin D  Level  If there are any abnormal results, you will receive a phone call from me to discuss next steps/follow up. If everything is normal, your results will be available in MyChart within the next few days.   I also ordered your Cologuard, which will be mailed to you. We also ordered your DEXA scan today. It has been scheduled and will print out on your AVS today.   For questions or concerns, please reach out via MyChart, or by calling the front desk at [phone]. For medication refills, please contact your pharmacy.  Be well, Dr. Lucie Pinal, DO

## 2024-08-07 NOTE — Progress Notes (Signed)
    SUBJECTIVE:   Chief compliant/HPI: annual examination  Stacy Mccormick is a 68 y.o. who presents today for an annual exam.   History tabs reviewed and updated.   Review of systems form reviewed and notable for bilateral ankle swelling, and dry eyes.   OBJECTIVE:   BP (!) 141/80   Pulse 81   Ht 5' (1.524 m)   Wt 132 lb 9.6 oz (60.1 kg)   SpO2 100%   BMI 25.90 kg/m   General: A&O, NAD HEENT: No sign of trauma, EOM grossly intact Cardiac: RRR, no m/r/g Respiratory: CTAB, normal WOB, no w/c/r GI: Soft, NTTP, non-distended  Extremities: NTTP, trace pitting edema to the bilateral ankles.   ASSESSMENT/PLAN:   Assessment & Plan Hypertension associated with type 2 diabetes mellitus (HCC) - follow up in 4 weeks to discuss medication adjustments Type 2 diabetes mellitus without complication, without long-term current use of insulin (HCC) -A1c and urine acr today   Annual Examination  See AVS for age appropriate recommendations  PHQ score appropriate, reviewed and discussed.   Considered the following items based upon USPSTF recommendations: Diabetes screening: ordered HIV testing:not indicated Hepatitis C: recently completed and result reviewed, normal  Hepatitis B:recently completed and result reviewed, normal  Reviewed risk factors for latent tuberculosis and not indicated Osteoporosis screening considered based upon risk of fracture from [facility] calculator. DEXA ordered.   Cancer Screening Discussion  Cervical cancer screening: no longer indicated Breast cancer screening: recently completed and repeat not yet indicated Discussed family history, BRCA testing not indicated. Lung cancer screening:not indicated as does not meet criteria.  See documentation below regarding indications/risks/benefits.  Colorectal cancer screening: discussed options, elected for cologuard.  Vaccinations up to date.   Follow up in 1  year or sooner if indicated.  MyChart Activation:  Already signed up  Lucie Pinal, DO [facility]

## 2024-08-07 NOTE — Assessment & Plan Note (Signed)
-  A1c and urine acr today

## 2024-08-07 NOTE — Assessment & Plan Note (Signed)
-   follow up in 4 weeks to discuss medication adjustments

## 2024-08-08 ENCOUNTER — Ambulatory Visit: Payer: Self-pay | Admitting: Family Medicine

## 2024-08-08 LAB — BASIC METABOLIC PANEL WITH GFR
BUN/Creatinine Ratio: 12 (ref 12–28)
BUN: 8 mg/dL (ref 8–27)
CO2: 22 mmol/L (ref 20–29)
Calcium: 9.8 mg/dL (ref 8.7–10.3)
Chloride: 103 mmol/L (ref 96–106)
Creatinine, Ser: 0.65 mg/dL (ref 0.57–1.00)
Glucose: 95 mg/dL (ref 70–99)
Potassium: 3.5 mmol/L (ref 3.5–5.2)
Sodium: 141 mmol/L (ref 134–144)
eGFR: 95 mL/min/1.73 (ref >59)

## 2024-08-08 LAB — VITAMIN D 25 HYDROXY (VIT D DEFICIENCY, FRACTURES): Vit D, 25-Hydroxy: 56.4 ng/mL (ref 30.0–100.0)

## 2024-08-08 LAB — LIPID PANEL
Chol/HDL Ratio: 2.7 ratio (ref 0.0–4.4)
Cholesterol, Total: 134 mg/dL (ref 100–199)
HDL: 50 mg/dL (ref >39)
LDL Chol Calc (NIH): 63 mg/dL (ref 0–99)
Triglycerides: 115 mg/dL (ref 0–149)
VLDL Cholesterol Cal: 21 mg/dL (ref 5–40)

## 2024-08-09 LAB — MICROALBUMIN / CREATININE URINE RATIO
Creatinine, Urine: 14 mg/dL
Microalb/Creat Ratio: <21 mg/g{creat} (ref 0–29)
Microalbumin, Urine: <3 ug/mL

## 2024-09-11 ENCOUNTER — Encounter: Payer: Self-pay | Admitting: Family Medicine

## 2024-09-11 ENCOUNTER — Ambulatory Visit (INDEPENDENT_AMBULATORY_CARE_PROVIDER_SITE_OTHER): Admitting: Family Medicine

## 2024-09-11 VITALS — BP 135/72 | HR 85 | Wt 134.4 lb

## 2024-09-11 DIAGNOSIS — E1169 Type 2 diabetes mellitus with other specified complication: Secondary | ICD-10-CM

## 2024-09-11 DIAGNOSIS — E785 Hyperlipidemia, unspecified: Secondary | ICD-10-CM | POA: Diagnosis not present

## 2024-09-11 DIAGNOSIS — E1159 Type 2 diabetes mellitus with other circulatory complications: Secondary | ICD-10-CM | POA: Diagnosis present

## 2024-09-11 DIAGNOSIS — I152 Hypertension secondary to endocrine disorders: Secondary | ICD-10-CM | POA: Diagnosis not present

## 2024-09-11 MED ORDER — AMLODIPINE BESYLATE 10 MG PO TABS: 10.0000 mg | ORAL_TABLET | Freq: Every day | ORAL | 1 refills | Status: AC

## 2024-09-11 MED ORDER — ATORVASTATIN CALCIUM 20 MG PO TABS: 20.0000 mg | ORAL_TABLET | Freq: Every day | ORAL | 3 refills | Status: AC

## 2024-09-11 MED ORDER — HYDROCHLOROTHIAZIDE 25 MG PO TABS: 25.0000 mg | ORAL_TABLET | Freq: Every day | ORAL | 1 refills | Status: AC

## 2024-09-11 NOTE — Assessment & Plan Note (Signed)
 After reviewing patient's home blood pressure logs, her systolic pressures range between 110 and 130 regularly with systolics as low as 50 but more commonly in the 60-70 range.  She had 1 high reading of 193/55 which appears to be an error given the extremely wide pulse pressure -No changes to medications today - Follow-up in 3 months for routine monitoring -Refilled amlodipine  and hydrochlorothiazide  as well as atorvastatin  per patient's request

## 2024-09-11 NOTE — Patient Instructions (Signed)
 It was wonderful to see you today!  Your blood pressure was at goal today. Please continue to take your medications every day, even when you feel well, as high blood pressure won't make you feel sick most of the time.   If there are any abnormal results, you will receive a phone call from me to discuss next steps/follow up. If everything is normal, your results will be available in MyChart within the next few days.   For questions or concerns, please reach out via MyChart, or by calling the front desk at [phone]. For medication refills, please contact your pharmacy.  Be well, Dr. Lucie Pinal, DO

## 2024-09-11 NOTE — Progress Notes (Signed)
" ° ° °  SUBJECTIVE:   CHIEF COMPLAINT / HPI:   HTN follow up  Not at goal last visit Current meds: -amlodipine  10 -hydrochlorothiazide  25  Labs: all wnl  Patient presents with logs of her home blood pressures over the last 3 weeks.  She has also brought her home cuff with her today.  She is compliant with all of her medications and has no concerns.  PERTINENT  PMH / PSH: Hypertension, diabetes  OBJECTIVE:   BP 135/72 (Cuff Size: Normal)   Pulse 85   Wt 134 lb 6.4 oz (61 kg)   SpO2 99%   BMI 26.25 kg/m   General: A&O, NAD Cardiac: RRR, no m/r/g Respiratory: CTAB, normal WOB, no w/c/r  ASSESSMENT/PLAN:   Assessment & Plan Hypertension associated with type 2 diabetes mellitus (HCC) After reviewing patient's home blood pressure logs, her systolic pressures range between 110 and 130 regularly with systolics as low as 50 but more commonly in the 60-70 range.  She had 1 high reading of 193/55 which appears to be an error given the extremely wide pulse pressure -No changes to medications today - Follow-up in 3 months for routine monitoring -Refilled amlodipine  and hydrochlorothiazide  as well as atorvastatin  per patient's request   Lucie Pinal, DO [facility] "

## 2024-11-29 ENCOUNTER — Other Ambulatory Visit (HOSPITAL_BASED_OUTPATIENT_CLINIC_OR_DEPARTMENT_OTHER)
